# Patient Record
Sex: Female | Born: 1960
Health system: Southern US, Community
[De-identification: ages and names within clinical notes are randomized; demographics above are authoritative.]

## PROBLEM LIST (undated history)

## (undated) DIAGNOSIS — D573 Sickle-cell trait: Secondary | ICD-10-CM

## (undated) DIAGNOSIS — Z780 Asymptomatic menopausal state: Secondary | ICD-10-CM

## (undated) DIAGNOSIS — K219 Gastro-esophageal reflux disease without esophagitis: Secondary | ICD-10-CM

## (undated) DIAGNOSIS — R51 Headache: Secondary | ICD-10-CM

## (undated) DIAGNOSIS — D649 Anemia, unspecified: Secondary | ICD-10-CM

## (undated) HISTORY — DX: Headache: R51

## (undated) HISTORY — DX: Sickle-cell trait: D57.3

## (undated) HISTORY — DX: Asymptomatic menopausal state: Z78.0

## (undated) HISTORY — DX: Anemia, unspecified: D64.9

## (undated) HISTORY — PX: CHOLECYSTECTOMY: SHX55

## (undated) HISTORY — DX: Gastro-esophageal reflux disease without esophagitis: K21.9

---

## 1997-07-21 HISTORY — PX: ABDOMINAL HYSTERECTOMY: SHX81

## 1997-12-26 ENCOUNTER — Ambulatory Visit (HOSPITAL_COMMUNITY): Admission: RE | Admit: 1997-12-26 | Discharge: 1997-12-26 | Payer: Self-pay | Admitting: Obstetrics and Gynecology

## 1998-01-16 ENCOUNTER — Inpatient Hospital Stay (HOSPITAL_COMMUNITY): Admission: EM | Admit: 1998-01-16 | Discharge: 1998-02-02 | Payer: Self-pay | Admitting: Emergency Medicine

## 1998-01-17 ENCOUNTER — Ambulatory Visit (HOSPITAL_COMMUNITY): Admission: RE | Admit: 1998-01-17 | Discharge: 1998-01-17 | Payer: Self-pay | Admitting: Obstetrics and Gynecology

## 1998-03-09 ENCOUNTER — Observation Stay (HOSPITAL_COMMUNITY): Admission: RE | Admit: 1998-03-09 | Discharge: 1998-03-10 | Payer: Self-pay | Admitting: *Deleted

## 1998-03-12 ENCOUNTER — Inpatient Hospital Stay (HOSPITAL_COMMUNITY): Admission: EM | Admit: 1998-03-12 | Discharge: 1998-03-17 | Payer: Self-pay | Admitting: Internal Medicine

## 1998-03-20 ENCOUNTER — Ambulatory Visit (HOSPITAL_COMMUNITY): Admission: RE | Admit: 1998-03-20 | Discharge: 1998-03-20 | Payer: Self-pay | Admitting: Gastroenterology

## 1998-04-20 ENCOUNTER — Ambulatory Visit (HOSPITAL_COMMUNITY): Admission: RE | Admit: 1998-04-20 | Discharge: 1998-04-20 | Payer: Self-pay | Admitting: Gastroenterology

## 1998-04-20 ENCOUNTER — Encounter: Payer: Self-pay | Admitting: Gastroenterology

## 1999-11-26 ENCOUNTER — Other Ambulatory Visit: Admission: RE | Admit: 1999-11-26 | Discharge: 1999-11-26 | Payer: Self-pay | Admitting: Internal Medicine

## 2002-05-12 ENCOUNTER — Encounter: Admission: RE | Admit: 2002-05-12 | Discharge: 2002-05-12 | Payer: Self-pay | Admitting: Internal Medicine

## 2002-05-12 ENCOUNTER — Encounter: Payer: Self-pay | Admitting: Internal Medicine

## 2003-05-03 ENCOUNTER — Encounter: Payer: Self-pay | Admitting: Internal Medicine

## 2003-05-03 ENCOUNTER — Ambulatory Visit (HOSPITAL_COMMUNITY): Admission: RE | Admit: 2003-05-03 | Discharge: 2003-05-03 | Payer: Self-pay | Admitting: Internal Medicine

## 2003-12-25 ENCOUNTER — Other Ambulatory Visit: Admission: RE | Admit: 2003-12-25 | Discharge: 2003-12-25 | Payer: Self-pay | Admitting: Internal Medicine

## 2004-07-21 HISTORY — PX: OOPHORECTOMY: SHX86

## 2004-08-09 ENCOUNTER — Ambulatory Visit: Payer: Self-pay | Admitting: Internal Medicine

## 2005-01-22 ENCOUNTER — Ambulatory Visit: Payer: Self-pay | Admitting: Gastroenterology

## 2005-02-11 ENCOUNTER — Ambulatory Visit: Payer: Self-pay | Admitting: Gastroenterology

## 2005-03-10 ENCOUNTER — Ambulatory Visit: Payer: Self-pay | Admitting: Internal Medicine

## 2005-10-23 ENCOUNTER — Ambulatory Visit: Payer: Self-pay | Admitting: Internal Medicine

## 2005-10-31 ENCOUNTER — Ambulatory Visit: Payer: Self-pay | Admitting: Internal Medicine

## 2005-11-04 ENCOUNTER — Ambulatory Visit: Payer: Self-pay | Admitting: Internal Medicine

## 2005-12-11 ENCOUNTER — Ambulatory Visit: Payer: Self-pay | Admitting: Internal Medicine

## 2006-03-12 ENCOUNTER — Ambulatory Visit (HOSPITAL_COMMUNITY): Admission: RE | Admit: 2006-03-12 | Discharge: 2006-03-12 | Payer: Self-pay | Admitting: Obstetrics and Gynecology

## 2006-03-12 ENCOUNTER — Encounter (INDEPENDENT_AMBULATORY_CARE_PROVIDER_SITE_OTHER): Payer: Self-pay | Admitting: Specialist

## 2007-01-18 ENCOUNTER — Telehealth (INDEPENDENT_AMBULATORY_CARE_PROVIDER_SITE_OTHER): Payer: Self-pay | Admitting: *Deleted

## 2007-01-18 ENCOUNTER — Emergency Department (HOSPITAL_COMMUNITY): Admission: EM | Admit: 2007-01-18 | Discharge: 2007-01-18 | Payer: Self-pay | Admitting: Family Medicine

## 2007-04-22 ENCOUNTER — Ambulatory Visit: Payer: Self-pay | Admitting: Internal Medicine

## 2007-04-22 DIAGNOSIS — D649 Anemia, unspecified: Secondary | ICD-10-CM

## 2007-04-22 DIAGNOSIS — K219 Gastro-esophageal reflux disease without esophagitis: Secondary | ICD-10-CM

## 2007-04-22 DIAGNOSIS — D573 Sickle-cell trait: Secondary | ICD-10-CM

## 2007-04-22 DIAGNOSIS — N924 Excessive bleeding in the premenopausal period: Secondary | ICD-10-CM | POA: Insufficient documentation

## 2007-04-22 HISTORY — DX: Sickle-cell trait: D57.3

## 2007-04-22 HISTORY — DX: Gastro-esophageal reflux disease without esophagitis: K21.9

## 2007-04-22 HISTORY — DX: Anemia, unspecified: D64.9

## 2007-04-22 LAB — CONVERTED CEMR LAB: FSH: 119.6 milliintl units/mL

## 2007-04-26 ENCOUNTER — Telehealth: Payer: Self-pay | Admitting: *Deleted

## 2007-07-07 ENCOUNTER — Ambulatory Visit: Payer: Self-pay | Admitting: Internal Medicine

## 2007-07-07 LAB — CONVERTED CEMR LAB
ALT: 19 units/L (ref 0–35)
AST: 23 units/L (ref 0–37)
Albumin: 4 g/dL (ref 3.5–5.2)
Alkaline Phosphatase: 69 units/L (ref 39–117)
BUN: 8 mg/dL (ref 6–23)
Basophils Absolute: 0 10*3/uL (ref 0.0–0.1)
Basophils Relative: 0.2 % (ref 0.0–1.0)
Bilirubin Urine: NEGATIVE
Bilirubin, Direct: 0.3 mg/dL (ref 0.0–0.3)
Blood in Urine, dipstick: NEGATIVE
CO2: 32 meq/L (ref 19–32)
Calcium: 9.3 mg/dL (ref 8.4–10.5)
Chloride: 108 meq/L (ref 96–112)
Cholesterol: 147 mg/dL (ref 0–200)
Creatinine, Ser: 0.6 mg/dL (ref 0.4–1.2)
Eosinophils Absolute: 0.1 10*3/uL (ref 0.0–0.6)
Eosinophils Relative: 1 % (ref 0.0–5.0)
GFR calc Af Amer: 139 mL/min
GFR calc non Af Amer: 115 mL/min
Glucose, Bld: 83 mg/dL (ref 70–99)
Glucose, Urine, Semiquant: NEGATIVE
HCT: 32.2 % — ABNORMAL LOW (ref 36.0–46.0)
HDL: 41.7 mg/dL (ref >39.0)
Hemoglobin: 10.8 g/dL — ABNORMAL LOW (ref 12.0–15.0)
Ketones, urine, test strip: NEGATIVE
LDL Cholesterol: 95 mg/dL (ref 0–99)
Lymphocytes Relative: 34.9 % (ref 12.0–46.0)
MCHC: 33.6 g/dL (ref 30.0–36.0)
MCV: 79.5 fL (ref 78.0–100.0)
Monocytes Absolute: 0.6 10*3/uL (ref 0.2–0.7)
Monocytes Relative: 10 % (ref 3.0–11.0)
Neutro Abs: 3.1 10*3/uL (ref 1.4–7.7)
Neutrophils Relative %: 53.9 % (ref 43.0–77.0)
Nitrite: NEGATIVE
Platelets: 172 10*3/uL (ref 150–400)
Potassium: 4.3 meq/L (ref 3.5–5.1)
Protein, U semiquant: NEGATIVE
RBC: 4.05 M/uL (ref 3.87–5.11)
RDW: 12.9 % (ref 11.5–14.6)
Sodium: 145 meq/L (ref 135–145)
Specific Gravity, Urine: 1.015
TSH: 1.92 microintl units/mL (ref 0.35–5.50)
Total Bilirubin: 1.6 mg/dL — ABNORMAL HIGH (ref 0.3–1.2)
Total CHOL/HDL Ratio: 3.5
Total Protein: 7 g/dL (ref 6.0–8.3)
Triglycerides: 53 mg/dL (ref 0–149)
Urobilinogen, UA: NEGATIVE
VLDL: 11 mg/dL (ref 0–40)
WBC: 5.8 10*3/uL (ref 4.5–10.5)
pH: 6.5

## 2007-07-13 ENCOUNTER — Ambulatory Visit: Payer: Self-pay | Admitting: Internal Medicine

## 2007-07-13 DIAGNOSIS — R51 Headache: Secondary | ICD-10-CM | POA: Insufficient documentation

## 2007-07-13 DIAGNOSIS — J309 Allergic rhinitis, unspecified: Secondary | ICD-10-CM | POA: Insufficient documentation

## 2007-07-13 DIAGNOSIS — R519 Headache, unspecified: Secondary | ICD-10-CM | POA: Insufficient documentation

## 2007-07-13 HISTORY — DX: Headache: R51

## 2007-07-19 ENCOUNTER — Ambulatory Visit: Payer: Self-pay | Admitting: Internal Medicine

## 2007-07-19 ENCOUNTER — Encounter: Payer: Self-pay | Admitting: Internal Medicine

## 2007-08-02 ENCOUNTER — Telehealth: Payer: Self-pay | Admitting: *Deleted

## 2008-01-17 ENCOUNTER — Emergency Department (HOSPITAL_COMMUNITY): Admission: EM | Admit: 2008-01-17 | Discharge: 2008-01-17 | Payer: Self-pay | Admitting: Family Medicine

## 2008-07-03 ENCOUNTER — Telehealth: Payer: Self-pay | Admitting: Internal Medicine

## 2008-08-18 LAB — CONVERTED CEMR LAB: Pap Smear: NORMAL

## 2009-03-05 ENCOUNTER — Encounter: Payer: Self-pay | Admitting: Internal Medicine

## 2009-04-20 ENCOUNTER — Ambulatory Visit: Payer: Self-pay | Admitting: Internal Medicine

## 2009-04-20 LAB — CONVERTED CEMR LAB
ALT: 20 units/L (ref 0–35)
AST: 25 units/L (ref 0–37)
Albumin: 4.3 g/dL (ref 3.5–5.2)
Alkaline Phosphatase: 67 units/L (ref 39–117)
BUN: 11 mg/dL (ref 6–23)
Basophils Absolute: 0 10*3/uL (ref 0.0–0.1)
Basophils Relative: 0.5 % (ref 0.0–3.0)
Bilirubin Urine: NEGATIVE
Bilirubin, Direct: 0.2 mg/dL (ref 0.0–0.3)
CO2: 32 meq/L (ref 19–32)
Calcium: 9.1 mg/dL (ref 8.4–10.5)
Chloride: 109 meq/L (ref 96–112)
Cholesterol: 142 mg/dL (ref 0–200)
Creatinine, Ser: 0.7 mg/dL (ref 0.4–1.2)
Eosinophils Absolute: 0 10*3/uL (ref 0.0–0.7)
Eosinophils Relative: 0.9 % (ref 0.0–5.0)
GFR calc non Af Amer: 114.98 mL/min (ref >60)
Glucose, Bld: 86 mg/dL (ref 70–99)
HCT: 30.9 % — ABNORMAL LOW (ref 36.0–46.0)
HDL: 42.4 mg/dL (ref >39.00)
Hemoglobin, Urine: NEGATIVE
Hemoglobin: 10.4 g/dL — ABNORMAL LOW (ref 12.0–15.0)
Ketones, ur: NEGATIVE mg/dL
LDL Cholesterol: 87 mg/dL (ref 0–99)
Leukocytes, UA: NEGATIVE
Lymphocytes Relative: 35.1 % (ref 12.0–46.0)
Lymphs Abs: 1.9 10*3/uL (ref 0.7–4.0)
MCHC: 33.7 g/dL (ref 30.0–36.0)
MCV: 79.1 fL (ref 78.0–100.0)
Monocytes Absolute: 0.6 10*3/uL (ref 0.1–1.0)
Monocytes Relative: 10.5 % (ref 3.0–12.0)
Neutro Abs: 2.8 10*3/uL (ref 1.4–7.7)
Neutrophils Relative %: 53 % (ref 43.0–77.0)
Nitrite: NEGATIVE
Platelets: 146 10*3/uL — ABNORMAL LOW (ref 150.0–400.0)
Potassium: 3.9 meq/L (ref 3.5–5.1)
RBC: 3.91 M/uL (ref 3.87–5.11)
RDW: 12.8 % (ref 11.5–14.6)
Sodium: 145 meq/L (ref 135–145)
Specific Gravity, Urine: 1.01 (ref 1.000–1.030)
TSH: 1.97 microintl units/mL (ref 0.35–5.50)
Total Bilirubin: 1.4 mg/dL — ABNORMAL HIGH (ref 0.3–1.2)
Total CHOL/HDL Ratio: 3
Total Protein, Urine: NEGATIVE mg/dL
Total Protein: 7.8 g/dL (ref 6.0–8.3)
Triglycerides: 62 mg/dL (ref 0.0–149.0)
Urine Glucose: NEGATIVE mg/dL
Urobilinogen, UA: 1 (ref 0.0–1.0)
VLDL: 12.4 mg/dL (ref 0.0–40.0)
WBC: 5.3 10*3/uL (ref 4.5–10.5)
pH: 6 (ref 5.0–8.0)

## 2009-04-26 ENCOUNTER — Ambulatory Visit: Payer: Self-pay | Admitting: Internal Medicine

## 2009-04-26 LAB — HM MAMMOGRAPHY: HM Mammogram: NORMAL

## 2009-05-09 ENCOUNTER — Telehealth: Payer: Self-pay | Admitting: Internal Medicine

## 2009-09-26 ENCOUNTER — Ambulatory Visit: Payer: Self-pay | Admitting: Internal Medicine

## 2009-11-12 ENCOUNTER — Encounter: Payer: Self-pay | Admitting: Internal Medicine

## 2009-11-12 ENCOUNTER — Ambulatory Visit: Payer: Self-pay | Admitting: Internal Medicine

## 2009-12-19 ENCOUNTER — Ambulatory Visit: Payer: Self-pay | Admitting: Family Medicine

## 2009-12-19 DIAGNOSIS — M722 Plantar fascial fibromatosis: Secondary | ICD-10-CM | POA: Insufficient documentation

## 2009-12-19 LAB — HM PAP SMEAR

## 2009-12-19 LAB — HM MAMMOGRAPHY

## 2009-12-26 ENCOUNTER — Telehealth: Payer: Self-pay | Admitting: Internal Medicine

## 2010-02-20 ENCOUNTER — Encounter (INDEPENDENT_AMBULATORY_CARE_PROVIDER_SITE_OTHER): Payer: Self-pay | Admitting: *Deleted

## 2010-04-12 ENCOUNTER — Encounter (INDEPENDENT_AMBULATORY_CARE_PROVIDER_SITE_OTHER): Payer: Self-pay | Admitting: *Deleted

## 2010-04-15 ENCOUNTER — Ambulatory Visit: Payer: Self-pay | Admitting: Gastroenterology

## 2010-04-26 LAB — HM COLONOSCOPY: HM Colonoscopy: NORMAL

## 2010-04-29 ENCOUNTER — Ambulatory Visit: Payer: Self-pay | Admitting: Gastroenterology

## 2010-07-12 ENCOUNTER — Ambulatory Visit: Payer: Self-pay | Admitting: Family Medicine

## 2010-08-20 NOTE — Assessment & Plan Note (Signed)
Summary: foot pain--on/off x 1 month //ccm   Vital Signs:  Patient profile:   50 year old female Temp:     98 degrees F oral BP sitting:   120 / 72  (left arm) Cuff size:   regular  Vitals Entered By: Sid Falcon LPN (December 19, 1608 9:31 AM) CC: Rt foot pain, N & T at times   History of Present Illness: R foot pain one month.  Pain in heel region.  Achy soreness.  More constant.   Pain 7/10 with walking.  No recent  chnage of shoes.  Tylenol without relief.  Epsom salt soaks helped some. Pain worse after periods of sitting.  No injury.  Allergies (verified): No Known Drug Allergies  Past History:  Past Medical History: Last updated: 07/13/2007 menopausal sickle cell trait anemia GERD Allergic rhinitis Headache  Review of Systems      See HPI  Physical Exam  General:  Well-developed,well-nourished,in no acute distress; alert,appropriate and cooperative throughout examination Lungs:  Normal respiratory effort, chest expands symmetrically. Lungs are clear to auscultation, no crackles or wheezes. Heart:  normal rate and regular rhythm.   Extremities:  R foot tender plantar fascia.  Other wise no bony tenderness.  Good foot pulses.  No achilles tenderness. Neurologic:  alert & oriented X3, strength normal in all extremities, sensation intact to light touch, and DTRs symmetrical and normal.     Impression & Recommendations:  Problem # 1:  FASCIITIS, PLANTAR (ICD-728.71) Assessment New discussed ice, stretches, antiinflammatories, and appropriate shoe wear.  Offered injection of steroid but pt declines at this time.  Complete Medication List: 1)  Folbee 2.5-25-1 Mg Tabs (Folic acid-vit b6-vit b12) .... One by mouth daily 2)  Amoxicillin 500 Mg Caps (Amoxicillin) .... One by mouth three times a day for 10 days 3)  Fexofenadine-pseudoephedrine 60-120 Mg Xr12h-tab (Fexofenadine-pseudoephedrine) .... One by mouth two times a day as needed congestion and post nasal  drip  Patient Instructions: 1)  Ice heel couple times per day. 2)  Stretch heel frequently 3)  Consider heel cup for symptom relief.  4)  Consider use of Alleve or Advil for symtptom relief.

## 2010-08-20 NOTE — Progress Notes (Signed)
Summary: SINUS PROBLEM  Phone Note Call from Patient Call back at [phone]   Caller: PT [facility]IVE Call For: Stacie Glaze MD Summary of Call: PATIENT WOULD LIKE SOMETHING CALLED IN FOR SINUS PROLBEM,HEAD CONGESTION, COUGHING UP BROWN MUSCUS, X 2 WEEKS.  [facility] [id number]. Initial call taken by: Celine Ahr,  July 03, 2008 9:57 AM  Follow-up for Phone Call        Has tried Pseudafed, Tylenol XS, Akla Seltzer Plus with no relief, has not tried any of the [employer], denies fever or ear pain, C/O scratchy sore throat, pain in her face, productive cough, headache and nasal congestion Follow-up by: Sid Falcon LPN,  July 03, 2008 10:13 AM  Additional Follow-up for Phone Call Additional follow up Details #1::        ceftin 250 two times a day for 10 days and  bromfed or by mouth two times a day lodrane two times a day  Additional Follow-up by: Stacie Glaze MD,  July 03, 2008 1:41 PM    Additional Follow-up for Phone Call Additional follow up Details #2::    Per Dr Lovell Sheehan, if pharmacy doe not have Bromfed, may use alternate Lodrane.  Rx sent electronically Follow-up by: Sid Falcon LPN,  July 03, 2008 3:34 PM  New/Updated Medications: CEFTIN 250 MG TABS (CEFUROXIME AXETIL) one tab two times a day BROMFED 12-15 MG XR12H-CAP (BROMPHENIRAMINE-PHENYLEPHRINE) one tab two times a day   Prescriptions: BROMFED 12-15 MG XR12H-CAP (BROMPHENIRAMINE-PHENYLEPHRINE) one tab two times a day  #14 x 0   Entered by:   Sid Falcon LPN   Authorized by:   Stacie Glaze MD   Signed by:   Sid Falcon LPN on 45/40/9811   Method used:   Electronically to        [facility] (retail)       [address]       [place], [place]  [address]       Ph: [phone]       Fax: [phone]   RxID:   [id number] CEFTIN 250 MG TABS (CEFUROXIME AXETIL) one tab two times a day  #20 x 0   Entered by:   Sid Falcon LPN   Authorized  by:   Stacie Glaze MD   Signed by:   Sid Falcon LPN on 66/44/0347   Method used:   Electronically to        [facility] (retail)       [address]       [place], [place]  [address]       Ph: [phone]       Fax: [phone]   RxID:   [id number]   Appended Document: SINUS PROBLEM Pt sister answered phone at pt home, not able to call pt work.  Informed sister to have pt check with her pharmacy.

## 2010-08-20 NOTE — Miscellaneous (Signed)
Summary: Orders Update  Clinical Lists Changes  Orders: Added new Test order of T-Bone Densitometry (77080) - Signed Added new Test order of T-Lumbar Vertebral Assessment (77082) - Signed 

## 2010-08-20 NOTE — Miscellaneous (Signed)
Summary: previsit prep/rm  Clinical Lists Changes  Medications: Added new medication of MOVIPREP 100 GM  SOLR (PEG-KCL-NACL-NASULF-NA ASC-C) As per prep instructions. - Signed Rx of MOVIPREP 100 GM  SOLR (PEG-KCL-NACL-NASULF-NA ASC-C) As per prep instructions.;  #1 x 0;  Signed;  Entered by: Sherren Kerns RN;  Authorized by: Louis Meckel MD;  Method used: Electronically to [facility] [address], [place], [place], [place]  [address], Ph: [phone], Fax: [phone] Observations: Added new observation of ALLERGY REV: Done (04/15/2010 8:05)    Prescriptions: MOVIPREP 100 GM  SOLR (PEG-KCL-NACL-NASULF-NA ASC-C) As per prep instructions.  #1 x 0   Entered by:   Sherren Kerns RN   Authorized by:   Louis Meckel MD   Signed by:   Sherren Kerns RN on 04/15/2010   Method used:   Electronically to        [facility] ([facility])       [address]       [place], [place]  [address]       Ph: [phone]       Fax: [phone]   RxID:   [id number]

## 2010-08-20 NOTE — Progress Notes (Signed)
Summary: cp and short breath  Phone Note Call from Patient   Complaint: Chest Pain Summary of Call: CP and short breath off & on started last week.   Todaypresent with dull upper back pain.  Had nosebleed & HA last pm.  [phone] Initial call taken by: Rudy Jew,  January 18, 2007 11:30 AM  Follow-up for Phone Call        Patient advised to go to ER Follow-up by: Stacie Glaze MD,  January 18, 2007 11:33 AM  Additional Follow-up for Phone Call Additional follow up Details #1::        pt advised Additional Follow-up by: Rudy Jew,  January 18, 2007 11:35 AM

## 2010-08-20 NOTE — Assessment & Plan Note (Signed)
Summary: cpx/ccm   Vital Signs:  Patient Profile:   50 Years Old Female Height:     64 inches Weight:      140 pounds Temp:     98.5 degrees F oral Pulse rate:   76 / minute Resp:     12 per minute BP sitting:   110 / 80  (left arm)  Vitals Entered By: Willy Eddy, LPN (April 22, 2007 2:10 PM)                 Chief Complaint:  c/o cold sweats/thinks she is menopausal/c/o sore throatwill make appt for cpx later.  History of Present Illness: Sinusitis   Current Allergies: No known allergies   Past Medical History:    Anemia-NOS    GERD    Sickle Cell Trait 282.5  Past Surgical History:    Cholecystectomy    Hysterectomy      Physical Exam  General:     Well-developed,well-nourished,in no acute distress; alert,appropriate and cooperative throughout examination Eyes:     No corneal or conjunctival inflammation noted. EOMI. Perrla. Funduscopic exam benign, without hemorrhages, exudates or papilledema. Vision grossly normal. Nose:     External nasal examination shows no deformity or inflammation. Nasal mucosa are pink and moist without lesions or exudates. Mouth:     Oral mucosa and oropharynx without lesions or exudates.  Teeth in good repair. Neck:     No deformities, masses, or tenderness noted. Lungs:     Normal respiratory effort, chest expands symmetrically. Lungs are clear to auscultation, no crackles or wheezes. Heart:     Normal rate and regular rhythm. S1 and S2 normal without gallop, murmur, click, rub or other extra sounds. Abdomen:     Bowel sounds positive,abdomen soft and non-tender without masses, organomegaly or hernias noted. Msk:     No deformity or scoliosis noted of thoracic or lumbar spine.   Pulses:     R and L carotid,radial,femoral,dorsalis pedis and posterior tibial pulses are full and equal bilaterally    Impression & Recommendations:  Problem # 1:  MENORRHEA, PREMENOPAUSAL (ICD-627.0)  Orders: T- * Misc.  Laboratory test [phone]) Venipuncture [phone]) TLB-FSH (Follicle Stimulating Hormone) (83001-FSH)   Problem # 2:  ANEMIA-NOS (ICD-285.9)  Problem # 3:  SICKLE-CELL TRAIT (ICD-282.5) anemia   Patient Instructions: 1)  schedule CPX in next 2 months may use two spots anywhere 2)  BMP prior to visit, ICD-9: 3)  Hepatic Panel prior to visit, ICD-9: 4)  Lipid Panel prior to visit, ICD-9: 5)  TSH prior to visit, ICD-9:  V70.0 6)  CBC w/ Diff prior to visit, ICD-9: 7)  Urine-dip prior to visit, ICD-9:    ]  Tetanus/Td Immunization History:    Tetanus/Td # 1:  Td (07/21/2005)

## 2010-08-20 NOTE — Letter (Signed)
Summary: [facility] Instructions  [facility]  [address] [place], [place] [address]   Phone: [phone]  Fax: [phone]       Deanna Watkins    1960-10-29    MRN: [id number]        Procedure Day Dorna Bloom:  Duanne Limerick  04/29/10     Arrival Time:  8:30AM      Procedure Time:  9:30AM     Location of Procedure:                    _ X_  [facility] (4th Floor)                       PREPARATION FOR COLONOSCOPY WITH MOVIPREP   Starting 5 days prior to your procedure 04/24/10 do not eat nuts, seeds, popcorn, corn, beans, peas,  salads, or any raw vegetables.  Do not take any fiber supplements (e.g. Metamucil, Citrucel, and Benefiber).  THE DAY BEFORE YOUR PROCEDURE         DATE: 04/28/10  DAY: SUNDAY  1.  Drink clear liquids the entire day-NO SOLID FOOD  2.  Do not drink anything colored red or purple.  Avoid juices with pulp.  No orange juice.  3.  Drink at least 64 oz. (8 glasses) of fluid/clear liquids during the day to prevent dehydration and help the prep work efficiently.  CLEAR LIQUIDS INCLUDE: Water Jello Ice Popsicles Tea (sugar ok, no milk/cream) Powdered fruit flavored drinks Coffee (sugar ok, no milk/cream) Gatorade Juice: apple, white grape, white cranberry  Lemonade Clear bullion, consomm, broth Carbonated beverages (any kind) Strained chicken noodle soup Hard Candy                             4.  In the morning, mix first dose of MoviPrep solution:    Empty 1 Pouch A and 1 Pouch B into the disposable container    Add lukewarm drinking water to the top line of the container. Mix to dissolve    Refrigerate (mixed solution should be used within 24 hrs)  5.  Begin drinking the prep at 5:00 p.m. The MoviPrep container is divided by 4 marks.   Every 15 minutes drink the solution down to the next mark (approximately 8 oz) until the full liter is complete.   6.  Follow completed prep with 16 oz of clear liquid of your choice (Nothing  red or purple).  Continue to drink clear liquids until bedtime.  7.  Before going to bed, mix second dose of MoviPrep solution:    Empty 1 Pouch A and 1 Pouch B into the disposable container    Add lukewarm drinking water to the top line of the container. Mix to dissolve    Refrigerate  THE DAY OF YOUR PROCEDURE      DATE: 04/29/10  DAY: MONDAY  Beginning at 4:30AM (5 hours before procedure):         1. Every 15 minutes, drink the solution down to the next mark (approx 8 oz) until the full liter is complete.  2. Follow completed prep with 16 oz. of clear liquid of your choice.    3. You may drink clear liquids until 7:30AM (2 HOURS BEFORE PROCEDURE).   MEDICATION INSTRUCTIONS  Unless otherwise instructed, you should take regular prescription medications with a small sip of water   as early as possible the morning  of your procedure.    Additional medication instructions: n/a         OTHER INSTRUCTIONS  You will need a responsible adult at least 50 years of age to accompany you and drive you home.   This person must remain in the waiting room during your procedure.  Wear loose fitting clothing that is easily removed.  Leave jewelry and other valuables at home.  However, you may wish to bring a book to read or  an iPod/MP3 player to listen to music as you wait for your procedure to start.  Remove all body piercing jewelry and leave at home.  Total time from sign-in until discharge is approximately 2-3 hours.  You should go home directly after your procedure and rest.  You can resume normal activities the  day after your procedure.  The day of your procedure you should not:   Drive   Make legal decisions   Operate machinery   Drink alcohol   Return to work  You will receive specific instructions about eating, activities and medications before you leave.    The above instructions have been reviewed and explained to me by   Sherren Kerns RN  April 15, 2010 8:39 AM  I fully understand and can verbalize these instructions _____________________________ Date _________

## 2010-08-20 NOTE — Letter (Signed)
Summary: Colonoscopy Letter  [facility]  [address] [place], [place] [address]   Phone: [phone]  Fax: [phone]      February 20, 2010 MRN: [id number]   CALEYAH JR [address] [place], [place]  [address]   Dear Ms. CRISOSTOMO,   According to your medical record, it is time for you to schedule a Colonoscopy. The American Cancer Society recommends this procedure as a method to detect early colon cancer. Patients with a family history of colon cancer, or a personal history of colon polyps or inflammatory bowel disease are at increased risk.  This letter has been generated based on the recommendations made at the time of your procedure. If you feel that in your particular situation this may no longer apply, please contact our office.  Please call our office at [phone] to schedule this appointment or to update your records at your earliest convenience.  Thank you for cooperating with [place] to provide you with the very best care possible.   Sincerely,   Barbette Hair. Arlyce Dice, M.D.  [facility] [phone]

## 2010-08-20 NOTE — Miscellaneous (Signed)
Summary: BONE DENSITY  Clinical Lists Changes  Orders: Added new Test order of T-Bone Densitometry (77080) - Signed Added new Test order of T-Lumbar Vertebral Assessment (77082) - Signed 

## 2010-08-20 NOTE — Progress Notes (Signed)
Summary: test results  Phone Note Call from Patient Call back at Work Phone [phone]   Caller: Patient Call For: Lovell Sheehan Reason for Call: Talk to Doctor, Lab or Test Results Summary of Call: Need results of Bone density test Initial call taken by: Barnie Mort,  August 02, 2007 10:27 AM  Follow-up for Phone Call        left message on machine not available yet and will call when it is available Follow-up by: Willy Eddy, LPN,  August 02, 2007 12:46 PM

## 2010-08-20 NOTE — Progress Notes (Signed)
Summary: Requesting Antibiotic  Phone Note Call from Patient Call back at Home Phone [phone] Call back at [phone]   Caller: Patient Summary of Call: Having flu like symptoms, daughter is sick as well would like to have an antibiotic called in to [facility] on [address]  Initial call taken by: Trixie Dredge,  May 09, 2009 10:36 AM  Follow-up for Phone Call        ACHING FEELING WITH NASAL CONGESTION -EXPLAINED NO ANTIBIOITC FOR FLU LIKE SX- SUGGESTED TO TR SX- ADVILL FOR MYALGIA DN MUCINEX FOR CONGESTION-CALL BACK IF NOT BETTER BY FRIDAY Follow-up by: Willy Eddy, LPN,  May 09, 2009 11:40 AM

## 2010-08-20 NOTE — Progress Notes (Signed)
Summary: LMTCB 6-8  Phone Note Call from Patient Call back at [facility] Phone [phone]   Summary of Call: OV request for Vascular headache for 3 days.  Back after last year.  Small jagged light that increases in eyes to point she can't see.  Headache starts & light goes away.  Headache is lingering.  2 Aleve helped the pain today completely.  Gets a little nausea.  Also wonders about sinus, around face & nose sore to touch.  [employer].  NKDA. Initial call taken by: Rudy Jew, RN,  December 26, 2009 12:40 PM  Follow-up for Phone Call        if it was relieved with aleve, it is probably not a sinus headache-could be migraine- contnue to take aleve as needed and see dr Lovell Sheehan in the n ext couple of weeks to evaluated for migraine - keep a headche log, documenting when head starts, how long it last, and what she was doing when it started per dr Lovell Sheehan . Follow-up by: Willy Eddy, LPN,  December 27, 6642 4:28 PM  Additional Follow-up for Phone Call Additional follow up Details #1::        Left message to call back.  Rudy Jew, RN  December 26, 2009 4:35 PM     Additional Follow-up for Phone Call Additional follow up Details #2::    Pt. notified. Follow-up by: Lynann Beaver CMA,  December 27, 2009 8:12 AM

## 2010-08-20 NOTE — Assessment & Plan Note (Signed)
Summary: SORE THROAT, SINUSITIS // RS   Vital Signs:  Patient profile:   50 year old female Height:      63.25 inches Weight:      151 pounds BMI:     26.63 Temp:     98.2 degrees F oral Pulse rate:   72 / minute Resp:     14 per minute BP sitting:   110 / 70  (left arm)  Vitals Entered By: Willy Eddy, LPN (September 26, 452 12:01 PM) CC: c/o sore thoat and post nasal drainage, URI symptoms   CC:  c/o sore thoat and post nasal drainage and URI symptoms.  History of Present Illness:  URI Symptoms      This is a 50 year old woman who presents with URI symptoms.  The patient reports nasal congestion, purulent nasal discharge, and sore throat, but denies clear nasal discharge, dry cough, productive cough, earache, and sick contacts.  The patient denies fever, low-grade fever (<100.5 degrees), fever of 100.5-103 degrees, fever of 103.1-104 degrees, fever to >104 degrees, stiff neck, dyspnea, wheezing, rash, vomiting, diarrhea, use of an antipyretic, and response to antipyretic.  The patient also reports headache.  The patient denies the following risk factors for Strep sinusitis: unilateral facial pain, unilateral nasal discharge, poor response to decongestant, double sickening, tooth pain, Strep exposure, tender adenopathy, and absence of cough.    Preventive Screening-Counseling & Management  Alcohol-Tobacco     Smoking Status: never     Passive Smoke Exposure: no  Problems Prior to Update: 1)  Family History of Colon Ca 1st Degree Relative <60  (ICD-V16.0) 2)  Family History Breast Cancer 1st Degree Relative <50  (ICD-V16.3) 3)  Headache  (ICD-784.0) 4)  Allergic Rhinitis  (ICD-477.9) 5)  Physical Examination  (ICD-V70.0) 6)  Sinusitis, Acute Frontal  (ICD-461.1) 7)  Menorrhea, Premenopausal  (ICD-627.0) 8)  Sickle-cell Trait  (ICD-282.5) 9)  Gerd  (ICD-530.81) 10)  Anemia-nos  (ICD-285.9)  Current Problems (verified): 1)  Family History of Colon Ca 1st Degree Relative  <60  (ICD-V16.0) 2)  Family History Breast Cancer 1st Degree Relative <50  (ICD-V16.3) 3)  Headache  (ICD-784.0) 4)  Allergic Rhinitis  (ICD-477.9) 5)  Physical Examination  (ICD-V70.0) 6)  Sinusitis, Acute Frontal  (ICD-461.1) 7)  Menorrhea, Premenopausal  (ICD-627.0) 8)  Sickle-cell Trait  (ICD-282.5) 9)  Gerd  (ICD-530.81) 10)  Anemia-nos  (ICD-285.9)  Medications Prior to Update: 1)  Folbee 2.5-25-1 Mg Tabs (Folic Acid-Vit B6-Vit B12) .... One By Mouth Daily  Current Medications (verified): 1)  Folbee 2.5-25-1 Mg Tabs (Folic Acid-Vit B6-Vit B12) .... One By Mouth Daily 2)  Amoxicillin 500 Mg Caps (Amoxicillin) .... One By Mouth Three Times A Day For 10 Days 3)  Fexofenadine-Pseudoephedrine 60-120 Mg Xr12h-Tab (Fexofenadine-Pseudoephedrine) .... One By Mouth Two Times A Day As Needed Congestion and Post Nasal Drip  Allergies (verified): No Known Drug Allergies  Past History:  Family History: Last updated: 07/13/2007 sister Family History Breast cancer 1st degree relative <50 mother Family History of Colon CA 1st degree relative <60  Social History: Last updated: 07/13/2007 Divorced Never Smoked Alcohol use-no Drug use-no Regular exercise-no  Risk Factors: Exercise: no (07/13/2007)  Risk Factors: Smoking Status: never (09/26/2009) Passive Smoke Exposure: no (09/26/2009)  Past medical, surgical, family and social histories (including risk factors) reviewed, and no changes noted (except as noted below).  Past Medical History: Reviewed history from 07/13/2007 and no changes required. menopausal sickle cell trait anemia GERD Allergic  rhinitis Headache  Past Surgical History: Reviewed history from 07/13/2007 and no changes required. Cholecystectomy Hysterectomy99 Oophorectomy2006  Family History: Reviewed history from 07/13/2007 and no changes required. sister Family History Breast cancer 1st degree relative <50 mother Family History of Colon CA 1st  degree relative <60  Social History: Reviewed history from 07/13/2007 and no changes required. Divorced Never Smoked Alcohol use-no Drug use-no Regular exercise-no  Review of Systems  The patient denies anorexia, fever, weight loss, weight gain, vision loss, decreased hearing, hoarseness, chest pain, syncope, dyspnea on exertion, peripheral edema, prolonged cough, headaches, hemoptysis, abdominal pain, melena, hematochezia, severe indigestion/heartburn, hematuria, incontinence, genital sores, muscle weakness, suspicious skin lesions, transient blindness, difficulty walking, depression, unusual weight change, abnormal bleeding, enlarged lymph nodes, angioedema, and breast masses.    Physical Exam  General:  Well-developed,well-nourished,in no acute distress; alert,appropriate and cooperative throughout examination Head:  normocephalic and atraumatic.   Eyes:  pupils equal and pupils round.   Ears:  R ear normal and no external deformities.   Nose:  no external deformity and nasal dischargemucosal pallor.   Mouth:  pharynx pink and moist and postnasal drip.   Neck:  No deformities, masses, or tenderness noted. Lungs:  Normal respiratory effort, chest expands symmetrically. Lungs are clear to auscultation, no crackles or wheezes. Heart:  Normal rate and regular rhythm. S1 and S2 normal without gallop, murmur, click, rub or other extra sounds. Abdomen:  Bowel sounds positive,abdomen soft and non-tender without masses, organomegaly or hernias noted. Msk:  No deformity or scoliosis noted of thoracic or lumbar spine.   Pulses:  R and L carotid,radial,femoral,dorsalis pedis and posterior tibial pulses are full and equal bilaterally   Impression & Recommendations:  Problem # 1:  ACUTE MAXILLARY SINUSITIS (ICD-461.0) discussed tratment and antibiotics Instructed on treatment. Call if symptoms persist or worsen.   Her updated medication list for this problem includes:    Amoxicillin 500 Mg  Caps (Amoxicillin) ..... One by mouth three times a day for 10 days    Fexofenadine-pseudoephedrine 60-120 Mg Xr12h-tab (Fexofenadine-pseudoephedrine) ..... One by mouth two times a day as needed congestion and post nasal drip  Complete Medication List: 1)  Folbee 2.5-25-1 Mg Tabs (Folic acid-vit b6-vit b12) .... One by mouth daily 2)  Amoxicillin 500 Mg Caps (Amoxicillin) .... One by mouth three times a day for 10 days 3)  Fexofenadine-pseudoephedrine 60-120 Mg Xr12h-tab (Fexofenadine-pseudoephedrine) .... One by mouth two times a day as needed congestion and post nasal drip  Patient Instructions: 1)  Take your antibiotic as prescribed until ALL of it is gone, but stop if you develop a rash or swelling and contact our office as soon as possible. Prescriptions: FEXOFENADINE-PSEUDOEPHEDRINE 60-120 MG XR12H-TAB (FEXOFENADINE-PSEUDOEPHEDRINE) one by mouth two times a day as needed congestion and post nasal drip  #60 x 2   Entered and Authorized by:   Stacie Glaze MD   Signed by:   Stacie Glaze MD on 09/26/2009   Method used:   Electronically to        [facility] (retail)       [address]       [place], [place]  [address]       Ph: [phone]       Fax: [phone]   RxID:   [id number] AMOXICILLIN 500 MG CAPS (AMOXICILLIN) one by mouth three times a day for 10 days  #30 x 0   Entered and Authorized by:  Stacie Glaze MD   Signed by:   Stacie Glaze MD on 09/26/2009   Method used:   Electronically to        [facility] (retail)       [address]       [place], [place]  [address]       Ph: [phone]       Fax: [phone]   RxID:   [id number]

## 2010-08-20 NOTE — Procedures (Signed)
Summary: Colonoscopy  Patient: Deanna Watkins Note: All result statuses are Final unless otherwise noted.  Tests: (1) Colonoscopy (COL)   COL Colonoscopy           DONE     [facility]     [address]     [place], [place]  [address]           COLONOSCOPY PROCEDURE REPORT           PATIENT:  Deanna Watkins, Deanna Watkins  MR#:  [id number]     BIRTHDATE:  10-Oct-1960, 48 yrs. old  GENDER:  female           ENDOSCOPIST:  Barbette Hair. Arlyce Dice, MD     Referred by:           PROCEDURE DATE:  04/29/2010     PROCEDURE:  Diagnostic Colonoscopy     ASA CLASS:  Class I     INDICATIONS:  1) Routine Risk Screening  2) family history of     colon cancer Mother           MEDICATIONS:   Fentanyl 75 mcg IV, Versed 7 mg IV           DESCRIPTION OF PROCEDURE:   After the risks benefits and     alternatives of the procedure were thoroughly explained, informed     consent was obtained.  Digital rectal exam was performed and     revealed no abnormalities.   The LB [id number] endoscope     was introduced through the anus and advanced to the cecum, which     was identified by both the appendix and ileocecal valve, without     limitations.  The quality of the prep was excellent, using     MiraLax.  The instrument was then slowly withdrawn as the colon     was fully examined.     <<PROCEDUREIMAGES>>           FINDINGS:  A normal appearing cecum, ileocecal valve, and     appendiceal orifice were identified. The ascending, hepatic     flexure, transverse, splenic flexure, descending, sigmoid colon,     and rectum appeared unremarkable (see image1, image2, image3,     image6, image7, image8, and image9).   Retroflexed views in the     rectum revealed no abnormalities.    The time to cecum =  2.75     minutes. The scope was then withdrawn (time =  6.0  min) from the     patient and the procedure completed.           COMPLICATIONS:  None           ENDOSCOPIC IMPRESSION:     1) Normal colon  RECOMMENDATIONS:     1) Given your significant family history of colon cancer, you     should have a repeat colonoscopy in 5 years           REPEAT EXAM:  In 5 year(s) for Colonoscopy.           ______________________________     Barbette Hair. Arlyce Dice, MD           CC: Stacie Glaze, MD           n.     Rosalie Doctor:   Barbette Hair. Kaplan at 04/29/2010 09:57 AM           Deanna Watkins, [id number]  Note: An exclamation mark (!) indicates a  result that was not dispersed into the flowsheet. Document Creation Date: 04/29/2010 9:57 AM _______________________________________________________________________  (1) Order result status: Final Collection or observation date-time: 04/29/2010 09:52 Requested date-time:  Receipt date-time:  Reported date-time:  Referring Physician:   Ordering Physician: Melvia Heaps [phone]) Specimen Source:  Source: Launa Grill Order Number: [id number] Lab site:   Appended Document: Colonoscopy    Clinical Lists Changes  Observations: Added new observation of COLONNXTDUE: 04/2015 (04/29/2010 10:44)

## 2010-08-20 NOTE — Assessment & Plan Note (Signed)
Summary: cpx/mhf   Vital Signs:  Patient profile:   50 year old female Height:      63.25 inches Weight:      154 pounds BMI:     27.16 Temp:     98.1 degrees F oral Pulse rate:   76 / minute Resp:     12 per minute BP sitting:   110 / 74  (left arm)  Vitals Entered By: Willy Eddy, LPN (April 26, 2009 8:44 AM)  CC:  cpx.  History of Present Illness: The pt was asked about all immunizations, health maint. services that are appropriate to their age and was given guidance on diet exercize  and weight management   Problems Prior to Update: 1)  Family History of Colon Ca 1st Degree Relative <60  (ICD-V16.0) 2)  Family History Breast Cancer 1st Degree Relative <50  (ICD-V16.3) 3)  Headache  (ICD-784.0) 4)  Allergic Rhinitis  (ICD-477.9) 5)  Physical Examination  (ICD-V70.0) 6)  Sinusitis, Acute Frontal  (ICD-461.1) 7)  Menorrhea, Premenopausal  (ICD-627.0) 8)  Sickle-cell Trait  (ICD-282.5) 9)  Gerd  (ICD-530.81) 10)  Anemia-nos  (ICD-285.9)  Medications Prior to Update: 1)  Ceftin 250 Mg Tabs (Cefuroxime Axetil) .... One Tab Two Times A Day 2)  Bromfed 12-15 Mg Xr12h-Cap (Brompheniramine-Phenylephrine) .... One Tab Two Times A Day  Current Medications (verified): 1)  Folbee 2.5-25-1 Mg Tabs (Folic Acid-Vit B6-Vit B12) .... One By Mouth Daily  Allergies (verified): No Known Drug Allergies  Past History:  Family History: Last updated: 07/13/2007 sister Family History Breast cancer 1st degree relative <50 mother Family History of Colon CA 1st degree relative <60  Social History: Last updated: 07/13/2007 Divorced Never Smoked Alcohol use-no Drug use-no Regular exercise-no  Risk Factors: Exercise: no (07/13/2007)  Risk Factors: Smoking Status: never (07/13/2007) Passive Smoke Exposure: no (07/13/2007)  Past medical, surgical, family and social histories (including risk factors) reviewed, and no changes noted (except as noted below).  Past Medical  History: Reviewed history from 07/13/2007 and no changes required. menopausal sickle cell trait anemia GERD Allergic rhinitis Headache  Past Surgical History: Reviewed history from 07/13/2007 and no changes required. Cholecystectomy Hysterectomy99 Oophorectomy2006  Family History: Reviewed history from 07/13/2007 and no changes required. sister Family History Breast cancer 1st degree relative <50 mother Family History of Colon CA 1st degree relative <60  Social History: Reviewed history from 07/13/2007 and no changes required. Divorced Never Smoked Alcohol use-no Drug use-no Regular exercise-no  Review of Systems  The patient denies anorexia, fever, weight loss, weight gain, vision loss, decreased hearing, hoarseness, chest pain, syncope, dyspnea on exertion, peripheral edema, prolonged cough, headaches, hemoptysis, abdominal pain, melena, hematochezia, severe indigestion/heartburn, hematuria, incontinence, genital sores, muscle weakness, suspicious skin lesions, transient blindness, difficulty walking, depression, unusual weight change, abnormal bleeding, enlarged lymph nodes, angioedema, and breast masses.    Physical Exam  General:  Well-developed,well-nourished,in no acute distress; alert,appropriate and cooperative throughout examination Head:  normocephalic and atraumatic.   Eyes:  pupils equal and pupils round.   Ears:  R ear normal and no external deformities.   Nose:  no external deformity and nasal dischargemucosal pallor.   Mouth:  pharynx pink and moist and postnasal drip.   Neck:  No deformities, masses, or tenderness noted. Lungs:  Normal respiratory effort, chest expands symmetrically. Lungs are clear to auscultation, no crackles or wheezes. Heart:  Normal rate and regular rhythm. S1 and S2 normal without gallop, murmur, click, rub or other extra sounds.  Abdomen:  Bowel sounds positive,abdomen soft and non-tender without masses, organomegaly or hernias  noted. Msk:  No deformity or scoliosis noted of thoracic or lumbar spine.   Pulses:  R and L carotid,radial,femoral,dorsalis pedis and posterior tibial pulses are full and equal bilaterally Psych:  Oriented X3 and memory intact for recent and remote.     Impression & Recommendations:  Problem # 1:  PHYSICAL EXAMINATION (ICD-V70.0) sick trait addressed as a part of a comprehensive physical The pt was asked about all immunizations, health maint. services that are appropriate to their age and was given guidance on diet exercize  and weight management  Mammogram: normal (08/18/2008) Pap smear: normal (08/18/2008) Colonoscopy: Normal (02/11/2005) Td Booster: Td (07/21/2005)   Flu Vax: Historical (04/25/2009)   Chol: 142 (04/20/2009)   HDL: 42.40 (04/20/2009)   LDL: 87 (04/20/2009)   TG: 62.0 (04/20/2009) TSH: 1.97 (04/20/2009)   Next mammogram due:: 08/2009 (04/26/2009)  Discussed using sunscreen, use of alcohol, drug use, self breast exam, routine dental care, routine eye care, schedule for GYN exam, routine physical exam, seat belts, multiple vitamins, osteoporosis prevention, adequate calcium intake in diet, recommendations for immunizations, mammograms and Pap smears.  Discussed exercise and checking cholesterol.  Discussed gun safety, safe sex, and contraception.  Problem # 2:  SICKLE-CELL TRAIT (ICD-282.5)  Complete Medication List: 1)  Folbee 2.5-25-1 Mg Tabs (Folic acid-vit b6-vit b12) .... One by mouth daily  Patient Instructions: 1)  Please schedule a follow-up appointment in 1 year.  for cpx Prescriptions: FOLBEE 2.5-25-1 MG TABS (FOLIC ACID-VIT B6-VIT B12) one by mouth daily  #30 x 11   Entered and Authorized by:   Stacie Glaze MD   Signed by:   Stacie Glaze MD on 04/26/2009   Method used:   Electronically to        [facility] ([facility]ail)       [address]       [place], [place]  [address]       Ph: [phone]       Fax:  [phone]   RxID:   [id number]    Immunization History:  Influenza Immunization History:    Influenza:  historical (04/25/2009)       Preventive Care Screening  Mammogram:    Date:  08/18/2008    Next Due:  08/2009    Results:  normal   Pap Smear:    Date:  08/18/2008    Next Due:  08/2011    Results:  normal

## 2010-08-20 NOTE — Letter (Signed)
Summary: [facility]  [facility]   Imported By: Maryln Gottron 03/12/2009 08:25:07  _____________________________________________________________________  External Attachment:    Type:   Image     Comment:   External Document

## 2010-09-25 ENCOUNTER — Encounter: Payer: Self-pay | Admitting: Family Medicine

## 2010-09-25 ENCOUNTER — Ambulatory Visit (INDEPENDENT_AMBULATORY_CARE_PROVIDER_SITE_OTHER): Payer: 59 | Admitting: Family Medicine

## 2010-09-25 VITALS — BP 116/80 | Temp 99.2°F | Ht 63.5 in | Wt 144.0 lb

## 2010-09-25 DIAGNOSIS — J310 Chronic rhinitis: Secondary | ICD-10-CM

## 2010-09-25 DIAGNOSIS — R5381 Other malaise: Secondary | ICD-10-CM

## 2010-09-25 MED ORDER — TRIAMCINOLONE ACETONIDE(NASAL) 55 MCG/ACT NA INHA: 2.0000 | Freq: Every day | NASAL | Status: DC

## 2010-09-25 NOTE — Patient Instructions (Signed)
Consider scheduling complete physical with Dr Lovell Sheehan.

## 2010-09-25 NOTE — Progress Notes (Signed)
  Subjective:    Patient ID: Deanna Watkins, female    DOB: 07-09-1961, 50 y.o.   MRN: [id number]  HPI  Patient is seen with some sinus pressure over the past several days. Not localizing. In frontal sinus region. Occasional headaches. One or 2 episodes of bloody discharge but no purulent secretions. Denies fever or chills. No sore throat.  Does have history of seasonal allergies in the past. Occasional fleeting vertigo symptoms. Some chronic tendinitis which has been worked up previously. No hearing change. Generalized fatigue. She has history of sickle cell trait and chronic low-grade anemia.   Review of Systems     Objective:   Physical Exam  the patient is alert and in no distress. Vital signs as noted  Nasal exam is unremarkable  Facial exam reveals no visible swelling and no tenderness. Neck supple, no adenopathy Chest clear to auscultation Heart regular rhythm and rate Neuro-alert and oriented.  CN 2-12 normal. No ataxia.  No focal strength deficits.       Assessment & Plan:   #1 rhinitis, probably allergic. Nasacort AQ 2 sprays per nostril once daily.  Doubt bacterial sinusitis at this time. #2 fatigue uncertain etiology. Patient  Encouraged to schedule complete physical with her primary physician

## 2010-10-03 ENCOUNTER — Encounter: Payer: Self-pay | Admitting: Internal Medicine

## 2010-10-04 ENCOUNTER — Ambulatory Visit (INDEPENDENT_AMBULATORY_CARE_PROVIDER_SITE_OTHER): Payer: 59 | Admitting: Internal Medicine

## 2010-10-04 ENCOUNTER — Encounter: Payer: Self-pay | Admitting: Internal Medicine

## 2010-10-04 VITALS — BP 114/80 | HR 84 | Temp 99.4°F | Ht 63.5 in | Wt 143.0 lb

## 2010-10-04 DIAGNOSIS — H9201 Otalgia, right ear: Secondary | ICD-10-CM | POA: Insufficient documentation

## 2010-10-04 DIAGNOSIS — D649 Anemia, unspecified: Secondary | ICD-10-CM

## 2010-10-04 DIAGNOSIS — H9209 Otalgia, unspecified ear: Secondary | ICD-10-CM

## 2010-10-04 DIAGNOSIS — H6690 Otitis media, unspecified, unspecified ear: Secondary | ICD-10-CM

## 2010-10-04 DIAGNOSIS — H6121 Impacted cerumen, right ear: Secondary | ICD-10-CM | POA: Insufficient documentation

## 2010-10-04 DIAGNOSIS — H9319 Tinnitus, unspecified ear: Secondary | ICD-10-CM

## 2010-10-04 DIAGNOSIS — H6120 Impacted cerumen, unspecified ear: Secondary | ICD-10-CM

## 2010-10-04 DIAGNOSIS — H9311 Tinnitus, right ear: Secondary | ICD-10-CM | POA: Insufficient documentation

## 2010-10-04 LAB — CBC WITH DIFFERENTIAL/PLATELET
Basophils Absolute: 0 10*3/uL (ref 0.0–0.1)
Basophils Relative: 0.5 % (ref 0.0–3.0)
Eosinophils Absolute: 0.1 10*3/uL (ref 0.0–0.7)
Eosinophils Relative: 0.9 % (ref 0.0–5.0)
HCT: 31.2 % — ABNORMAL LOW (ref 36.0–46.0)
Hemoglobin: 10.5 g/dL — ABNORMAL LOW (ref 12.0–15.0)
Lymphocytes Relative: 38.1 % (ref 12.0–46.0)
Lymphs Abs: 2.2 10*3/uL (ref 0.7–4.0)
MCHC: 33.6 g/dL (ref 30.0–36.0)
MCV: 79.3 fl (ref 78.0–100.0)
Monocytes Absolute: 0.3 10*3/uL (ref 0.1–1.0)
Monocytes Relative: 6 % (ref 3.0–12.0)
Neutro Abs: 3.1 10*3/uL (ref 1.4–7.7)
Neutrophils Relative %: 54.5 % (ref 43.0–77.0)
Platelets: 181 10*3/uL (ref 150.0–400.0)
RBC: 3.94 Mil/uL (ref 3.87–5.11)
RDW: 15 % — ABNORMAL HIGH (ref 11.5–14.6)
WBC: 5.7 10*3/uL (ref 4.5–10.5)

## 2010-10-04 LAB — IRON: Iron: 37 ug/dL — ABNORMAL LOW (ref 42–145)

## 2010-10-04 MED ORDER — AMOXICILLIN-POT CLAVULANATE 875-125 MG PO TABS: 1.0000 | ORAL_TABLET | Freq: Two times a day (BID) | ORAL | Status: AC

## 2010-10-04 NOTE — Assessment & Plan Note (Signed)
.   Informed consent was obtained and peroxide gel was inserted into the right ear, using the lavage kit the ear was lavaged until clean inspection with a cerumen spoon to make sure residual wax was not present patient tolerated the procedure well.

## 2010-10-04 NOTE — Progress Notes (Signed)
Subjective:    Patient ID: Deanna Watkins, female    DOB: 1961-01-21, 50 y.o.   MRN: [id number]  HPI  this is a 50 year old African American female who was seen by my partner for a keep upper respiratory tract inflammation or infection is determined to possibly be allergic in etiology and she was placed on Nasonex this did not successfully alleviate her symptoms she talk to her pharmacist who recommended that she take an antihistamine with decongestant combination and this has helped dry up some of the symptoms however the underlying disturbing symptom of dizziness has not resolved in this period of time it should be noted that the patient has a history of tendinitis she was evaluated by an ear nose and throat doctor who referred her to a neurologist for further evaluation neither the specialist could come up with an explanation for why she experiences ringing in her right ear.   Review of Systems  Constitutional: Negative for activity change, appetite change and fatigue.  HENT: Positive for ear pain and sinus pressure. Negative for congestion, neck pain and postnasal drip.   Eyes: Negative for redness and visual disturbance.       [ 3 beat nystagmus to right Respiratory: Negative for cough, shortness of breath and wheezing.   Gastrointestinal: Negative for abdominal pain and abdominal distention.  Genitourinary: Negative for dysuria, frequency and menstrual problem.  Musculoskeletal: Negative for myalgias, joint swelling and arthralgias.  Skin: Negative for rash and wound.  Neurological: Positive for dizziness. Negative for weakness and headaches.  Hematological: Negative for adenopathy. Does not bruise/bleed easily.  Psychiatric/Behavioral: Negative for sleep disturbance and decreased concentration.   Past Medical History  Diagnosis Date  . Sickle-cell trait 04/22/2007  . ANEMIA-NOS 04/22/2007  . Headache 07/13/2007  . GERD 04/22/2007  . Menopause   . Sickle cell trait    Past  Surgical History  Procedure Date  . Cholecystectomy   . Abdominal hysterectomy 1999  . Oophorectomy 2006    reports that she has never smoked. She does not have any smokeless tobacco history on file. Her alcohol and drug histories not on file. family history includes Cancer in her mother and sister. No Known Allergies     Objective:   Physical Exam  Constitutional: She is oriented to person, place, and time. She appears well-developed and well-nourished. No distress.  HENT:  Head: Normocephalic and atraumatic.  Nose: Nose normal.  Mouth/Throat: Oropharynx is clear and moist.  Eyes: Conjunctivae and EOM are normal. Pupils are equal, round, and reactive to light.        3 beat nystagmus to the right right canal blocked with wax after lavage the canal was erythematous and there was retraction of the right tympanic membrane  Neck: Normal range of motion. Neck supple. No JVD present. No tracheal deviation present. No thyromegaly present.  Cardiovascular: Normal rate, regular rhythm, normal heart sounds and intact distal pulses.   No murmur heard. Pulmonary/Chest: Effort normal and breath sounds normal. She has no wheezes. She exhibits no tenderness.  Abdominal: Soft. Bowel sounds are normal.  Musculoskeletal: Normal range of motion. She exhibits no edema and no tenderness.  Lymphadenopathy:    She has no cervical adenopathy.  Neurological: She is alert and oriented to person, place, and time. She has normal reflexes. No cranial nerve deficit.  Skin: Skin is warm and dry. She is not diaphoretic.  Psychiatric: She has a normal mood and affect. Her behavior is normal.  Assessment & Plan:   the patient was diagnosed with bilateral wax impaction and otitis media of the right ear this is the possible etiology of the tendinitis as well as the dizziness and her persistent feeling of discomfort in her ear she also has a history of anemia and we will draw a CBC and an iron level to  monitor that should her symptoms of tendinitis not change would refer to audiology for testing

## 2010-10-04 NOTE — Assessment & Plan Note (Signed)
Increased fatigue

## 2010-10-04 NOTE — Assessment & Plan Note (Signed)
After clearing ear of was underlying infection was seen as the cause of the dizzyness

## 2010-10-04 NOTE — Patient Instructions (Signed)
Please continue the Zyrtec D. With the antibiotic until both are finished for 10 days

## 2010-10-07 ENCOUNTER — Telehealth: Payer: Self-pay | Admitting: Internal Medicine

## 2010-10-07 NOTE — Telephone Encounter (Signed)
Pt called req lab results. Pls call back as soon as avail.

## 2010-10-08 NOTE — Telephone Encounter (Signed)
Increase to BID 

## 2010-10-08 NOTE — Telephone Encounter (Signed)
Pt informed iron is 37 and hgb is 10.5--she takes folbee qd--any changes?

## 2010-10-08 NOTE — Telephone Encounter (Signed)
Pt called back again to get lab results. Pls call back asap today.

## 2010-10-08 NOTE — Telephone Encounter (Signed)
Left message on machine for pt 

## 2010-12-06 NOTE — H&P (Signed)
NAME:  Deanna Watkins, Deanna Watkins               ACCOUNT NO.:  [id number]   MEDICAL RECORD NO.:  [id number]          PATIENT TYPE:  AMB   LOCATION:  [facility]                           FACILITY:  [facility]   PHYSICIAN:  Guy Sandifer. Henderson Cloud, M.D. DATE OF BIRTH:  [birth year]   DATE OF ADMISSION:  03/12/2006  DATE OF DISCHARGE:                                HISTORY & PHYSICAL   CHIEF COMPLAINT:  Pelvic cyst.   HISTORY OF PRESENT ILLNESS:  This patient is a 50 year old African-American  female status post postpartum hysterectomy, who has had pain in her left  hip.  An MRI obtained during that evaluation revealed a complex right  ovarian cyst.  Subsequent ultrasound in my office revealed a 1.9 cm cystic  mass medial to the right ovary as well as simple cysts at both ovaries,  measuring 2 cm and less.  Repeat ultrasound on March 05, 2006 revealed  persistence of the cystic mass medial to the ovary as well as cysts on both  ovaries.  The possibility of hydrosalpinx versus persistent ovarian cysts  were discussed with the patient.  After discussion of the options, she is  being admitted for laparoscopy, possibly bilateral salpingectomy, possible  unilateral salpingo-oophorectomy.  Potential risks and complications have  been discussed preoperatively.   PAST MEDICAL HISTORY:  Sickle cell trait.   PAST SURGICAL HISTORY:  Post partum hysterectomy, as above.   MEDICATIONS:  None.   ALLERGIES:  No known drug allergies.   SOCIAL HISTORY:  Denies tobacco, alcohol, or drug abuse.   FAMILY HISTORY:  Positive for history of anemia in mother, thyroid  dysfunction in sister, colon cancer in mother, breast cancer in sister,  breast cancer in maternal grandmother.   REVIEW OF SYSTEMS:  NEURO:  Denies headache.  CARDIAC:  Denies chest pain.  PULMONARY:  Denies shortness of breath.  GI:  Denies recent changes in bowel  habits.   PHYSICAL EXAMINATION:  VITAL SIGNS:  Height 5 feet, 5 inches.  Blood  pressure 112/80.  HEENT:   Without thyromegaly.  LUNGS:  Clear to auscultation.  HEART:  Regular rate and rhythm.  BREASTS:  Not examined.  ABDOMEN:  Soft with mild tenderness to deep palpation of the right lower  quadrant without mass or rebound.  PELVIC:  There is a mildly to moderately tender mass effect high in the  right adnexa, difficult to reach with the examining hand.  Adnexa nontender  without masses.  EXTREMITIES:  Grossly within normal limits.  NEUROLOGIC:  Grossly within normal limits.   ASSESSMENT:  Bilateral pelvic cysts.   PLAN:  Laparoscopy, possible bilateral salpingectomy, possible unilateral  oophorectomy.      Guy Sandifer Henderson Cloud, M.D.  Electronically Signed     JET/MEDQ  D:  03/05/2006  T:  03/05/2006  Job:  [id number]

## 2010-12-06 NOTE — Op Note (Signed)
NAME:  Deanna Watkins, Deanna Watkins               ACCOUNT NO.:  [id number]   MEDICAL RECORD NO.:  [id number]          PATIENT TYPE:  AMB   LOCATION:  [facility]C                           FACILITY:  [facility]   PHYSICIAN:  Guy Sandifer. Henderson Cloud, M.D. DATE OF BIRTH:  January 01, 1961   DATE OF PROCEDURE:  03/12/2006  DATE OF DISCHARGE:                                 OPERATIVE REPORT   PREOPERATIVE DIAGNOSIS:  Bilateral pelvic cyst.   POSTOPERATIVE DIAGNOSIS:  Bilateral pelvic cyst.   PROCEDURE:  Laparoscopy with right salpingo-oophorectomy, resection of left  paratubal cyst x2 and left ovarian biopsy and lysis of adhesions.   SURGEON:  Guy Sandifer. Henderson Cloud, M.D.   ANESTHESIA:  General endotracheal intubation.   ESTIMATED BLOOD LOSS:  Drops.   SPECIMENS:  1. Right tube and ovary.  2. Paratubal cyst x2.  3. Left ovarian biopsy.  All to pathology.   MEDICATIONS AND CONSENT:  This patient is a 50 year old African American  female status post hysterectomy with bilateral pelvic cyst.  Details are  dictated in the history and physical.  Laparoscopy, possible right salpingo-  oophorectomy, possible bilateral salpingectomy have been discussed with the  patient.  Potential risks and complications have been reviewed  preoperatively, including, but not limited to, infection, bowel, bladder or  ureteral damage, bleeding requiring transfusion of blood products with  possible transfusion reaction, HIV and hepatitis acquisition, DVT, PE,  pneumonia, recurrent cyst and/or pelvic pain.  Consent is signed on the  chart.   FINDINGS:  Upper abdomen is grossly normal.  There a few filmy adhesions on  the omentum to the anterior abdominal wall below and to the right of the  umbilicus.  There are multiple filmy adhesions of the omentum over the left  adnexa.  The fallopian tubes have fused in the midline creating one  continuous bridge of tissue.  The right ovary is largely displaced by three  2 cm translucent cysts.  The left ovary  contains a single smooth-walled 3 cm  cyst.  There are two 1 cm left paratubal cysts.   PROCEDURE:  The patient was taken to the operating room where she is  identified, placed in dorsal supine position and general anesthesia was  induced with endotracheal intubation.  She is then placed in dorsal  lithotomy position where she is prepped abdominally and vaginally.  Bladder  straight catheterized.  Ring clamp with a sponge is placed in the vagina and  she is draped in a sterile fashion.  Infraumbilical and suprapubic areas are  infiltrated in the midline with 0.5% plain lidocaine.  An infraumbilical  incision is made.  A disposable Veress needle was placed on the first  attempt without difficulty.  Normal syringe and drop test are noted.  2  liters of gas were insufflated under low pressure with good tympany in the  right upper quadrant.  Veress needle is removed and a 10/11 XL bladeless  disposable trocar sleeve was placed using direct visualization with the  diagnostic laparoscope.  The diagnostic laparoscope was then replaced by the  operative laparoscope.  Small suprapubic incision is made and  a 5-mm XL  bladeless disposable trocar sleeve was placed under direct visualization  without difficulty.  The above findings are noted.  The adhesions over the  left adnexa are taken down sharply without difficulty.  The right ureter is  then noted to be well clear of the area of surgery.  Using the gyrus bipolar  cautery cutting instrument, the right infundibulopelvic ligament is taken  down and followed by the mesosalpinx.  After releasing the right tube and  ovary from its pedicle, it is taken down in the midline from its attachment  to the left fallopian tube and placed in the cul-de-sac.  The left paratubal  cysts are then resected with the bipolar cautery and removed through the  suprapubic port.  Attempts at aspirating the left ovarian cyst revealed  little fluid.  Therefore, a left ovarian  biopsy is carried out removing much  of the cyst wall.  Bipolar cautery is used to obtain complete hemostasis.  Irrigation is carried out.  Inspection under reduced pneumoperitoneum  reveals good hemostasis.  Interceed is backloaded through the laparoscope  and placed over the left tube and ovary.  Then, using a 5-mm laparoscope  through the suprapubic trocar sleeve, a bag is placed through the umbilical  trocar sleeve, and under direct visualization, the right tube and ovary are  retrieved without difficulty through the umbilicus.  Switching back to the  operative laparoscope, inspection again reveals excellent hemostasis.  Suprapubic trocar sleeve was removed.  Pneumoperitoneum is reduced and the  umbilical trocar sleeve was removed.  0-Vicryl suture is used to close the  subcutaneous layer in the umbilical incision.  Dermabond is used to close  the skin on both incisions.  Instruments removed from the vagina.  All  counts are correct.  The patient is awakened and taken to recovery room in  stable condition.      Guy Sandifer Henderson Cloud, M.D.  Electronically Signed     JET/MEDQ  D:  03/12/2006  T:  03/12/2006  Job:  [id number]

## 2010-12-13 ENCOUNTER — Other Ambulatory Visit (INDEPENDENT_AMBULATORY_CARE_PROVIDER_SITE_OTHER): Payer: 59

## 2010-12-13 ENCOUNTER — Encounter: Payer: Self-pay | Admitting: Internal Medicine

## 2010-12-13 ENCOUNTER — Ambulatory Visit (INDEPENDENT_AMBULATORY_CARE_PROVIDER_SITE_OTHER): Payer: 59 | Admitting: Internal Medicine

## 2010-12-13 VITALS — BP 102/80 | Temp 99.0°F | Wt 145.0 lb

## 2010-12-13 DIAGNOSIS — Z0000 Encounter for general adult medical examination without abnormal findings: Secondary | ICD-10-CM

## 2010-12-13 DIAGNOSIS — J029 Acute pharyngitis, unspecified: Secondary | ICD-10-CM

## 2010-12-13 LAB — POCT URINALYSIS DIPSTICK
Bilirubin, UA: NEGATIVE
Blood, UA: NEGATIVE
Glucose, UA: NEGATIVE
Ketones, UA: NEGATIVE
Leukocytes, UA: NEGATIVE
Nitrite, UA: NEGATIVE
Protein, UA: NEGATIVE
Spec Grav, UA: 1.015
Urobilinogen, UA: 0.2
pH, UA: 6

## 2010-12-13 LAB — CBC WITH DIFFERENTIAL/PLATELET
Basophils Absolute: 0 10*3/uL (ref 0.0–0.1)
Basophils Relative: 0.4 % (ref 0.0–3.0)
Eosinophils Absolute: 0 10*3/uL (ref 0.0–0.7)
Eosinophils Relative: 0.8 % (ref 0.0–5.0)
HCT: 31.6 % — ABNORMAL LOW (ref 36.0–46.0)
Hemoglobin: 10.8 g/dL — ABNORMAL LOW (ref 12.0–15.0)
Lymphocytes Relative: 33.1 % (ref 12.0–46.0)
Lymphs Abs: 1.9 10*3/uL (ref 0.7–4.0)
MCHC: 34 g/dL (ref 30.0–36.0)
MCV: 80.3 fl (ref 78.0–100.0)
Monocytes Absolute: 0.4 10*3/uL (ref 0.1–1.0)
Monocytes Relative: 7.2 % (ref 3.0–12.0)
Neutro Abs: 3.3 10*3/uL (ref 1.4–7.7)
Neutrophils Relative %: 58.5 % (ref 43.0–77.0)
Platelets: 156 10*3/uL (ref 150.0–400.0)
RBC: 3.93 Mil/uL (ref 3.87–5.11)
RDW: 14.6 % (ref 11.5–14.6)
WBC: 5.7 10*3/uL (ref 4.5–10.5)

## 2010-12-13 LAB — BASIC METABOLIC PANEL
BUN: 13 mg/dL (ref 6–23)
CO2: 29 mEq/L (ref 19–32)
Calcium: 9.1 mg/dL (ref 8.4–10.5)
Chloride: 110 mEq/L (ref 96–112)
Creatinine, Ser: 0.7 mg/dL (ref 0.4–1.2)
GFR: 116.1 mL/min (ref >60.00)
Glucose, Bld: 84 mg/dL (ref 70–99)
Potassium: 4.2 mEq/L (ref 3.5–5.1)
Sodium: 144 mEq/L (ref 135–145)

## 2010-12-13 LAB — HEPATIC FUNCTION PANEL
ALT: 17 U/L (ref 0–35)
AST: 25 U/L (ref 0–37)
Albumin: 4.2 g/dL (ref 3.5–5.2)
Alkaline Phosphatase: 68 U/L (ref 39–117)
Bilirubin, Direct: 0.2 mg/dL (ref 0.0–0.3)
Total Bilirubin: 1.4 mg/dL — ABNORMAL HIGH (ref 0.3–1.2)
Total Protein: 7.4 g/dL (ref 6.0–8.3)

## 2010-12-13 LAB — POCT RAPID STREP A (OFFICE): Rapid Strep A Screen: NEGATIVE

## 2010-12-13 LAB — LIPID PANEL
Cholesterol: 132 mg/dL (ref 0–200)
HDL: 48.9 mg/dL (ref >39.00)
LDL Cholesterol: 73 mg/dL (ref 0–99)
Total CHOL/HDL Ratio: 3
Triglycerides: 53 mg/dL (ref 0.0–149.0)
VLDL: 10.6 mg/dL (ref 0.0–40.0)

## 2010-12-13 LAB — TSH: TSH: 1.76 u[IU]/mL (ref 0.35–5.50)

## 2010-12-13 MED ORDER — LEVOFLOXACIN 500 MG PO TABS: 500.0000 mg | ORAL_TABLET | Freq: Every day | ORAL | Status: DC

## 2010-12-13 NOTE — Progress Notes (Signed)
patient  Is aware 

## 2010-12-14 ENCOUNTER — Encounter: Payer: Self-pay | Admitting: Internal Medicine

## 2010-12-14 DIAGNOSIS — J029 Acute pharyngitis, unspecified: Secondary | ICD-10-CM | POA: Insufficient documentation

## 2010-12-14 NOTE — Progress Notes (Signed)
  Subjective:    Patient ID: Deanna Watkins, female    DOB: [birth year], 50 y.o.   MRN: [id number]  HPI Patient presents to clinic for evaluation of neck pain. States 5 days ago had a sensation of drainage in her right ear without actual drainage. The next day progressed to right upper lateral neck pain and swelling. Is placed on Augmentin at her workplace and tolerates without adverse effect. Notes persistent swelling and tenderness starting below her right ear extending forward along the jawline. Has associated sore throat without fever chills or difficulty swallowing. No obvious sick exposures. No other alleviating or exacerbating factors. No other complaints.  Reviewed past medical history, medications and allergies    Review of Systems see history of present illness     Objective:   Physical Exam  Nursing note and vitals reviewed. Constitutional: She appears well-developed and well-nourished. No distress.  HENT:  Head: Normocephalic and atraumatic.  Right Ear: Tympanic membrane, external ear and ear canal normal.  Left Ear: Tympanic membrane, external ear and ear canal normal.  Nose: Nose normal.  Mouth/Throat: Mucous membranes are normal. No uvula swelling. Posterior oropharyngeal erythema present. No oropharyngeal exudate, posterior oropharyngeal edema or tonsillar abscesses.  Neck:       Tenderness along the right lower jaw line moving forward to proximally submandibular gland. No parotid gland enlargement. Full range of motion of jaw. No bony abnormality. No masses appreciated. No obvious cervical adenopathy.  Skin: She is not diaphoretic.          Assessment & Plan:

## 2010-12-14 NOTE — Assessment & Plan Note (Signed)
Stop Augmentin. Begin Levaquin. Obtain rapid strep.Followup if no improvement or worsening.

## 2010-12-20 ENCOUNTER — Ambulatory Visit (INDEPENDENT_AMBULATORY_CARE_PROVIDER_SITE_OTHER): Payer: 59 | Admitting: Internal Medicine

## 2010-12-20 ENCOUNTER — Encounter: Payer: Self-pay | Admitting: Internal Medicine

## 2010-12-20 DIAGNOSIS — Z0000 Encounter for general adult medical examination without abnormal findings: Secondary | ICD-10-CM

## 2010-12-20 DIAGNOSIS — D573 Sickle-cell trait: Secondary | ICD-10-CM

## 2010-12-20 MED ORDER — LEVOFLOXACIN 500 MG PO TABS: 500.0000 mg | ORAL_TABLET | Freq: Every day | ORAL | Status: AC

## 2010-12-20 NOTE — Progress Notes (Signed)
  Subjective:    Patient ID: Deanna Watkins, female    DOB: 1960/12/08, 51 y.o.   MRN: [id number]  HPIcpx     Review of Systems  Constitutional: Negative for activity change, appetite change and fatigue.  HENT: Negative for ear pain, congestion, neck pain, postnasal drip and sinus pressure.   Eyes: Negative for redness and visual disturbance.  Respiratory: Negative for cough, shortness of breath and wheezing.   Gastrointestinal: Negative for abdominal pain and abdominal distention.  Genitourinary: Negative for dysuria, frequency and menstrual problem.  Musculoskeletal: Negative for myalgias, joint swelling and arthralgias.  Skin: Negative for rash and wound.  Neurological: Negative for dizziness, weakness and headaches.  Hematological: Negative for adenopathy. Does not bruise/bleed easily.  Psychiatric/Behavioral: Negative for sleep disturbance and decreased concentration.       Objective:   Physical Exam  Constitutional: She is oriented to person, place, and time. She appears well-developed and well-nourished. No distress.  HENT:  Head: Normocephalic and atraumatic.  Right Ear: External ear normal.  Left Ear: External ear normal.  Nose: Nose normal.  Mouth/Throat: Oropharynx is clear and moist.  Eyes: Conjunctivae and EOM are normal. Pupils are equal, round, and reactive to light.  Neck: Normal range of motion. Neck supple. No JVD present. No tracheal deviation present. No thyromegaly present.  Cardiovascular: Normal rate, regular rhythm, normal heart sounds and intact distal pulses.   No murmur heard. Pulmonary/Chest: Effort normal and breath sounds normal. She has no wheezes. She exhibits no tenderness.  Abdominal: Soft. Bowel sounds are normal.  Musculoskeletal: Normal range of motion. She exhibits no edema and no tenderness.  Lymphadenopathy:    She has no cervical adenopathy.  Neurological: She is alert and oriented to person, place, and time. She has normal reflexes.  No cranial nerve deficit.  Skin: Skin is warm and dry. She is not diaphoretic.  Psychiatric: She has a normal mood and affect. Her behavior is normal.          Assessment & Plan:   This is a routine physical examination for this healthy  Female. Reviewed all health maintenance protocols including mammography colonoscopy bone density and reviewed appropriate screening labs. Her immunization history was reviewed as well as her current medications and allergies refills of her chronic medications were given and the plan for yearly health maintenance was discussed all orders and referrals were made as appropriate. Persistent mild parotitis we'll continue Levaquin for 5 additional days and bring about one month for recheck

## 2011-01-16 ENCOUNTER — Encounter: Payer: Self-pay | Admitting: Internal Medicine

## 2011-01-24 ENCOUNTER — Ambulatory Visit: Payer: 59 | Admitting: Internal Medicine

## 2011-02-05 ENCOUNTER — Other Ambulatory Visit: Payer: Self-pay | Admitting: *Deleted

## 2011-02-07 ENCOUNTER — Ambulatory Visit: Payer: 59 | Admitting: Internal Medicine

## 2011-02-17 ENCOUNTER — Ambulatory Visit: Payer: 59 | Admitting: Internal Medicine

## 2011-02-19 ENCOUNTER — Encounter: Payer: Self-pay | Admitting: Internal Medicine

## 2011-02-19 ENCOUNTER — Ambulatory Visit (INDEPENDENT_AMBULATORY_CARE_PROVIDER_SITE_OTHER): Payer: 59 | Admitting: Internal Medicine

## 2011-02-19 VITALS — BP 120/80 | HR 68 | Temp 98.2°F | Resp 14 | Ht 64.5 in | Wt 142.0 lb

## 2011-02-19 DIAGNOSIS — S139XXA Sprain of joints and ligaments of unspecified parts of neck, initial encounter: Secondary | ICD-10-CM

## 2011-02-19 DIAGNOSIS — S161XXA Strain of muscle, fascia and tendon at neck level, initial encounter: Secondary | ICD-10-CM

## 2011-02-19 DIAGNOSIS — D649 Anemia, unspecified: Secondary | ICD-10-CM

## 2011-02-19 LAB — CBC WITH DIFFERENTIAL/PLATELET
Basophils Absolute: 0 10*3/uL (ref 0.0–0.1)
Basophils Relative: 0.6 % (ref 0.0–3.0)
Eosinophils Absolute: 0 10*3/uL (ref 0.0–0.7)
Eosinophils Relative: 0.6 % (ref 0.0–5.0)
HCT: 33.5 % — ABNORMAL LOW (ref 36.0–46.0)
Hemoglobin: 11 g/dL — ABNORMAL LOW (ref 12.0–15.0)
Lymphocytes Relative: 36.2 % (ref 12.0–46.0)
Lymphs Abs: 1.9 10*3/uL (ref 0.7–4.0)
MCHC: 32.8 g/dL (ref 30.0–36.0)
MCV: 79.8 fl (ref 78.0–100.0)
Monocytes Absolute: 0.4 10*3/uL (ref 0.1–1.0)
Monocytes Relative: 7.6 % (ref 3.0–12.0)
Neutro Abs: 3 10*3/uL (ref 1.4–7.7)
Neutrophils Relative %: 55 % (ref 43.0–77.0)
Platelets: 158 10*3/uL (ref 150.0–400.0)
RBC: 4.2 Mil/uL (ref 3.87–5.11)
RDW: 14.5 % (ref 11.5–14.6)
WBC: 5.4 10*3/uL (ref 4.5–10.5)

## 2011-02-19 MED ORDER — CYCLOBENZAPRINE HCL 5 MG PO TABS: 5.0000 mg | ORAL_TABLET | Freq: Three times a day (TID) | ORAL | Status: AC | PRN

## 2011-02-19 NOTE — Progress Notes (Signed)
Subjective:    Patient ID: Deanna Watkins, female    DOB: 1960/07/25, 50 y.o.   MRN: [id number]  HPI Patient presents for followup of cervical adenopathy no painful lumps felt at this time She reports pain in the middle 2 fingers of her right hand. No detectable numbness. No weakness the main symptom is soreness. No tendinitis associated with it. Has been feeling some intermittent pain in the entire right arm and some tingling in the fingers associated with that... Also due monitoring of her complete blood count as per history of anemia and sickle trait    Review of Systems  Constitutional: Negative for activity change, appetite change and fatigue.  HENT: Negative for ear pain, congestion, neck pain, postnasal drip and sinus pressure.   Eyes: Negative for redness and visual disturbance.  Respiratory: Negative for cough, shortness of breath and wheezing.   Gastrointestinal: Negative for abdominal pain and abdominal distention.  Genitourinary: Negative for dysuria, frequency and menstrual problem.  Musculoskeletal: Negative for myalgias, joint swelling and arthralgias.  Skin: Negative for rash and wound.  Neurological: Negative for dizziness, weakness and headaches.  Hematological: Negative for adenopathy. Does not bruise/bleed easily.  Psychiatric/Behavioral: Negative for sleep disturbance and decreased concentration.       Past Medical History  Diagnosis Date  . Sickle-cell trait 04/22/2007  . ANEMIA-NOS 04/22/2007  . Headache 07/13/2007  . GERD 04/22/2007  . Menopause   . Sickle cell trait    Past Surgical History  Procedure Date  . Cholecystectomy   . Abdominal hysterectomy 1999  . Oophorectomy 2006    reports that she has never smoked. She does not have any smokeless tobacco history on file. She reports that she does not drink alcohol or use illicit drugs. family history includes Cancer in her mother and sister. No Known Allergies  Objective:   Physical Exam  Nursing  note and vitals reviewed. Constitutional: She is oriented to person, place, and time. She appears well-developed and well-nourished. No distress.  HENT:  Head: Normocephalic and atraumatic.  Right Ear: External ear normal.  Left Ear: External ear normal.  Nose: Nose normal.  Mouth/Throat: Oropharynx is clear and moist.  Eyes: Conjunctivae and EOM are normal. Pupils are equal, round, and reactive to light.  Neck: Normal range of motion. Neck supple. No JVD present. No tracheal deviation present. No thyromegaly present.  Cardiovascular: Normal rate, regular rhythm, normal heart sounds and intact distal pulses.   No murmur heard. Pulmonary/Chest: Effort normal and breath sounds normal. She has no wheezes. She exhibits no tenderness.  Abdominal: Soft. Bowel sounds are normal.  Musculoskeletal: Normal range of motion. She exhibits no edema and no tenderness.  Lymphadenopathy:    She has no cervical adenopathy.  Neurological: She is alert and oriented to person, place, and time. She has normal reflexes. No cranial nerve deficit.  Skin: Skin is warm and dry. She is not diaphoretic.  Psychiatric: She has a normal mood and affect. Her behavior is normal.          Assessment & Plan:   It is apparent that the patient has a chronic cervical strain syndrome probably due to her her job and the use of computers at work.  We discussed appropriate accommodations at work place to reduce cervical strain exercises that can alleviate cervical strain and the use of ice and heat for the acute pain as well as and muscle relaxants take at bedtime.  She is aware that she will need to contact the  Armed forces training and education officer at work to see if changes to her chair and desk can be made.  The prescription was sent to her pharmacy for Flexeril 5 mg by mouth each bedtime and as needed.  Will monitor CBC differential for her sickle cell and the resultant anemia otherwise she is stable and will have followup  appointment

## 2011-02-19 NOTE — Patient Instructions (Signed)
Cervical and Neck Sprain and Strain (Neck Sprain and Strain) A cervical sprain is an injury to the neck. The injury can include either over-stretching or even small tears in the ligaments that hold the bones of the neck in place. A strain affects muscles and tendons. Minor injuries usually only involve ligaments and muscles. Because the different parts of the neck are so close together, more severe injuries can involve both sprain and strain. These injuries can affect the muscles, ligaments, tendons, discs, and nerves in the neck. SYMPTOMS  Pain, soreness, stiffness, or burning sensation in the front, back, or sides of the neck. This may develop immediately after injury. Onset of discomfort may also develop slowly and not begin for 24 hours or more.   Shoulder and/or upper back pain.   Limits to the normal movement of the neck.   Headache.  Weakness and/or abnormal sensation (such as numbness or tingling) of one or both arms and/or hands.   Muscle spasm. CAUSES An injury may be the result of a direct blow or from certain habits that can lead to the symptoms noted ab  Lifestyle or awkward postures:   Cradling a telephone between the ear and shoulder.   Sitting in a chair that offers no support.   Working at an [employer] manager station.   Activities that require hours of repeated or long periods of looking up (stretching the neck backward) or looking down (bending the head/neck forward).  DIAGNOSIS  Most of the time, your caregiver can diagnose this problem with a careful history and examination. The history will include information about known problems (such as arthritis in the neck) or a previous neck injury. X-rays may be ordered to find out if there is a different problem. X-rays can also help to find problems with the bones of the neck not related to the injury or current symptoms. TREATMENT Several treatment options are available to help pain, spasm, and other symptoms. They  include:  Cold helps relieve pain and reduce inflammation. Cold should be applied for 10 to 15 minutes every 2 to 3 hours after any activity that aggravates your symptoms. Use ice packs or an ice massage. Place a towel or cloth in between your skin and the ice pack.   Medication:   Only take over-the-counter or prescription medicines for pain, discomfort, or fever as directed by your caregiver.   Pain relievers or muscle relaxants may be prescribed. Use only as directed and only as much as you need.   Change in the activity that caused the problem. This might include using a headset with a telephone so that the phone is not propped between your ear and shoulder.     Work station. Changes may be needed in your work place. A better sitting position and/or better posture during work may be part of your treatment.   Physical Therapy. Your caregiver may recommend physical therapy. This can include instructions in the use of stretching and strengthening exercises. Improvement in posture is important. Exercises and posture training can help stabilize the neck and strengthen muscles and keep symptoms from returning.  HOME CARE INSTRUCTIONS  Other than formal physical therapy, all treatments above can be done at home. Even when not at work, it is important to be conscious of your posture and of activities that can cause a return of symptoms. Most cervical sprains and/or strains are better in 1-3 weeks. As you improve and increase activities, doing a warm up and stretching before the activity will  help  prevent recurrent problems.  Place ice for 15 minutes twice a day followed by 15 minutes  Of heat   clock rotation stretching in both direction and shoulder shrugs up and down in the morning and before bed SEEK MEDICAL CARE IF:   Pain is not effectively controlled with medication.   You feel unable to decrease pain medication over time as planned.   Activity level is not improving as planned and/or  expected.  SEEK IMMEDIATE MEDICAL CARE IF:   While using medication, you develop any bleeding, stomach upset, or signs of an allergic reaction.   Symptoms get worse, become intolerable, and are not helped by medications.   New, unexplained symptoms develop.   You experience numbness, tingling, weakness, or paralysis of any part of your body.  MAKE SURE YOU:   Understand these instructions.   Will watch your condition.   Will get help right away if you are not doing well or get worse.  Document Released: 05/04/2007 Document Re-Released: 10/03/2008 [facility] Patient Information 2011 [place], [place].

## 2011-03-27 ENCOUNTER — Inpatient Hospital Stay (INDEPENDENT_AMBULATORY_CARE_PROVIDER_SITE_OTHER)
Admission: RE | Admit: 2011-03-27 | Discharge: 2011-03-27 | Disposition: A | Discharge status: Home / Self Care | Payer: 59 | Source: Ambulatory Visit | Attending: Family Medicine | Admitting: Family Medicine

## 2011-03-27 DIAGNOSIS — R0789 Other chest pain: Secondary | ICD-10-CM

## 2011-04-02 ENCOUNTER — Encounter: Payer: Self-pay | Admitting: Internal Medicine

## 2011-04-02 ENCOUNTER — Ambulatory Visit (INDEPENDENT_AMBULATORY_CARE_PROVIDER_SITE_OTHER): Payer: 59 | Admitting: Internal Medicine

## 2011-04-02 DIAGNOSIS — R0789 Other chest pain: Secondary | ICD-10-CM | POA: Insufficient documentation

## 2011-04-02 DIAGNOSIS — R079 Chest pain, unspecified: Secondary | ICD-10-CM

## 2011-04-02 LAB — CBC
HCT: 30.8 % — ABNORMAL LOW (ref 36.0–46.0)
Hemoglobin: 11.1 g/dL — ABNORMAL LOW (ref 12.0–15.0)
MCH: 26.5 pg (ref 26.0–34.0)
MCHC: 36 g/dL (ref 30.0–36.0)
MCV: 73.5 fL — ABNORMAL LOW (ref 78.0–100.0)
Platelets: 149 10*3/uL — ABNORMAL LOW (ref 150–400)
RBC: 4.19 MIL/uL (ref 3.87–5.11)
RDW: 14 % (ref 11.5–15.5)
WBC: 6.1 10*3/uL (ref 4.0–10.5)

## 2011-04-02 LAB — BASIC METABOLIC PANEL
BUN: 12 mg/dL (ref 6–23)
CO2: 30 mEq/L (ref 19–32)
Calcium: 9 mg/dL (ref 8.4–10.5)
Chloride: 107 mEq/L (ref 96–112)
Creat: 0.8 mg/dL (ref 0.50–1.10)
Glucose, Bld: 93 mg/dL (ref 70–99)
Potassium: 3.7 mEq/L (ref 3.5–5.3)
Sodium: 143 mEq/L (ref 135–145)

## 2011-04-02 LAB — D-DIMER, QUANTITATIVE: D-Dimer, Quant: 0.85 ug/mL-FEU — ABNORMAL HIGH (ref 0.00–0.48)

## 2011-04-02 LAB — TSH: TSH: 1.464 u[IU]/mL (ref 0.350–4.500)

## 2011-04-02 MED ORDER — ESOMEPRAZOLE MAGNESIUM 40 MG PO CPDR: 40.0000 mg | DELAYED_RELEASE_CAPSULE | Freq: Every day | ORAL | Status: DC

## 2011-04-02 NOTE — Assessment & Plan Note (Signed)
Atypical chest pain in 50 year old [place] American female with minimal risk factors. I suspect her symptoms may be coming from GERD vs stress rxn. Start Nexium 40 mg once daily. Rule out other causes Obtain chest x-ray, CBC, BMET, thyroid studies, d-dimer Patient advised to call office if symptoms persist or worsen. Followup with PCP in one - two weeks.

## 2011-04-02 NOTE — Progress Notes (Signed)
Subjective:    Patient ID: Deanna Watkins, female    DOB: Jul 08, 1961, 50 y.o.   MRN: [id number]  HPI 50 year old [place] American female for urgent care followup. Patient was seen one week ago for chest pain. Patient reports her symptoms have been intermittent for the last 2-3 weeks. She describes substernal chest pain. She denies radiation to neck or arm. She reports occasional cough which makes chest pain worse which led her to seek medical care.  Patient reports symptoms recurred today while sitting at work. She works as an Licensed conveyancer. She describes as mild heaviness that turn into "tiny little pains"  She does have history of occasional reflux symptoms and has globus sensation in her lower throat. She has been trying over-the-counter Prilosec x1 week without significant improvement.  She is a nonsmoker. She is not diabetic or hypertensive. She denies family history of premature coronary disease. She denies lower extremity swelling or pain.  She exercises on occasion. She has never had any exertional chest pain. Review of Systems Negative for associated shortness of breath,  occasional heartburn    Past Medical History  Diagnosis Date  . Sickle-cell trait 04/22/2007  . ANEMIA-NOS 04/22/2007  . Headache 07/13/2007  . GERD 04/22/2007  . Menopause   . Sickle cell trait     History   Social History  . Marital Status: Divorced    Spouse Name: N/A    Number of Children: N/A  . Years of Education: N/A   Occupational History  . Not on file.   Social History Main Topics  . Smoking status: Never Smoker   . Smokeless tobacco: Not on file  . Alcohol Use: No  . Drug Use: No  . Sexually Active: Yes   Other Topics Concern  . Not on file   Social History Narrative  . No narrative on file    Past Surgical History  Procedure Date  . Cholecystectomy   . Abdominal hysterectomy 1999  . Oophorectomy 2006    Family History  Problem Relation Age of Onset    . Cancer Mother     colon  . Cancer Sister     breast    No Known Allergies  Current Outpatient Prescriptions on File Prior to Visit  Medication Sig Dispense Refill  . Cetirizine-Pseudoephedrine (ZYRTEC-D PO) Take 10 mg by mouth daily as needed.        . cyclobenzaprine (FLEXERIL) 5 MG tablet Take 1 tablet (5 mg total) by mouth every 8 (eight) hours as needed for muscle spasms (at bed time).  30 tablet  1  . Folic Acid-Vit B6-Vit B12 (FOLBEE) 2.5-25-1 MG TABS Take 1 tablet by mouth 2 (two) times daily.       Marland Kitchen triamcinolone (NASACORT AQ) 55 MCG/ACT nasal inhaler 2 sprays by Nasal route daily.  1 Inhaler  2    BP 114/82  Pulse 83  Temp(Src) 98.3 F (36.8 C) (Oral)  Ht 5\' 3"  (1.6 m)  Wt 145 lb (65.772 kg)  BMI 25.69 kg/m2  SpO2 99%    Objective:   Physical Exam   Constitutional: Appears well-developed and well-nourished. No distress.  Head: Normocephalic and atraumatic.  Right Ear: External ear normal.  Left Ear: External ear normal.  Mouth/Throat: Oropharynx is clear and moist.  Eyes: Conjunctivae are normal. Pupils are equal, round, and reactive to light.  Neck: Normal range of motion. Neck supple. No thyromegaly present. No carotid bruit Cardiovascular: Normal rate, regular rhythm and normal heart sounds.  Exam reveals no gallop and no friction rub.   No murmur heard. Pulmonary/Chest: Effort normal and breath sounds normal.  No wheezes. No rales.  Abdominal: Soft. Mild epigastric tenderness  Neurological: Alert. No cranial nerve deficit.  Skin: Skin is warm and dry.  Psychiatric: Normal mood and affect. Behavior is normal.   EKG from 03/27/2011 reviewed-normal sinus rhythm at 76 beats per minute. No ST changes noted     Assessment & Plan:

## 2011-04-02 NOTE — Patient Instructions (Signed)
Please call our office if your symptoms do not improve or gets worse. Stop all over the counter pain meds

## 2011-04-03 ENCOUNTER — Ambulatory Visit (INDEPENDENT_AMBULATORY_CARE_PROVIDER_SITE_OTHER)
Admission: RE | Admit: 2011-04-03 | Discharge: 2011-04-03 | Disposition: A | Discharge status: Home / Self Care | Payer: 59 | Source: Ambulatory Visit | Attending: Internal Medicine | Admitting: Internal Medicine

## 2011-04-03 DIAGNOSIS — R079 Chest pain, unspecified: Secondary | ICD-10-CM

## 2011-04-04 ENCOUNTER — Ambulatory Visit (INDEPENDENT_AMBULATORY_CARE_PROVIDER_SITE_OTHER)
Admission: RE | Admit: 2011-04-04 | Discharge: 2011-04-04 | Disposition: A | Discharge status: Home / Self Care | Payer: 59 | Source: Ambulatory Visit | Attending: Internal Medicine | Admitting: Internal Medicine

## 2011-04-04 ENCOUNTER — Other Ambulatory Visit: Payer: Self-pay | Admitting: Internal Medicine

## 2011-04-04 DIAGNOSIS — I2699 Other pulmonary embolism without acute cor pulmonale: Secondary | ICD-10-CM

## 2011-04-04 MED ORDER — IOHEXOL 300 MG/ML  SOLN
80.0000 mL | Freq: Once | INTRAMUSCULAR | Status: AC | PRN
  Administered 2011-04-04: 80 mL via INTRAVENOUS

## 2011-05-07 LAB — I-STAT 8, (EC8 V) (CONVERTED LAB)
Acid-Base Excess: 1
BUN: 7
Bicarbonate: 26.2 — ABNORMAL HIGH
Chloride: 108
Glucose, Bld: 86
HCT: 37
Hemoglobin: 12.6
Operator id: 247071
Potassium: 3.5
Sodium: 141
TCO2: 27
pCO2, Ven: 42.4 — ABNORMAL LOW
pH, Ven: 7.399 — ABNORMAL HIGH

## 2011-05-07 LAB — POCT I-STAT CREATININE
Creatinine, Ser: 0.6
Operator id: 247071

## 2012-03-24 ENCOUNTER — Other Ambulatory Visit: Payer: Self-pay | Admitting: Internal Medicine

## 2012-03-29 ENCOUNTER — Ambulatory Visit (INDEPENDENT_AMBULATORY_CARE_PROVIDER_SITE_OTHER): Payer: 59 | Admitting: Family

## 2012-03-29 ENCOUNTER — Encounter: Payer: Self-pay | Admitting: Family

## 2012-03-29 VITALS — BP 112/80 | Temp 98.3°F | Wt 148.0 lb

## 2012-03-29 DIAGNOSIS — R110 Nausea: Secondary | ICD-10-CM

## 2012-03-29 DIAGNOSIS — M898X1 Other specified disorders of bone, shoulder: Secondary | ICD-10-CM

## 2012-03-29 DIAGNOSIS — M899 Disorder of bone, unspecified: Secondary | ICD-10-CM

## 2012-03-29 DIAGNOSIS — M949 Disorder of cartilage, unspecified: Secondary | ICD-10-CM

## 2012-03-29 MED ORDER — MELOXICAM 15 MG PO TABS: 15.0000 mg | ORAL_TABLET | Freq: Every day | ORAL | Status: DC

## 2012-03-29 NOTE — Progress Notes (Signed)
Subjective:    Patient ID: Deanna Watkins, female    DOB: 05-05-1961, 51 y.o.   MRN: [id number]  HPI 51 year old [place] American female, nonsmoker, patient of Dr. Lovell Sheehan is in today with complaints of pain in both collarbones x4 days. She rates pain a 6-7/10, worse with movement. Had a massage on Friday and she believes may have caused her symptoms. Has taken ibuprofen with not much relief. She's also had some nausea, denies vomiting. No abdominal pain, lightheadedness, dizziness, chest pain, palpitations, shortness of breath or edema. She has taken Alka-Seltzer and has gotten relief. Does report an increase in belching.   Review of Systems  Constitutional: Negative.   Respiratory: Negative.   Cardiovascular: Negative.  Negative for chest pain.  Gastrointestinal: Positive for nausea. Negative for vomiting, abdominal pain, diarrhea, constipation, abdominal distention and rectal pain.  Musculoskeletal:       Pain in the collar bones bilaterally. Tender to touch.  Skin: Negative.   Neurological: Negative.   Hematological: Negative.   Psychiatric/Behavioral: Negative.    No family history of Cardiovascular disease or MI.   Past Medical History  Diagnosis Date  . Sickle-cell trait 04/22/2007  . ANEMIA-NOS 04/22/2007  . Headache 07/13/2007  . GERD 04/22/2007  . Menopause   . Sickle cell trait     History   Social History  . Marital Status: Divorced    Spouse Name: N/A    Number of Children: N/A  . Years of Education: N/A   Occupational History  . Not on file.   Social History Main Topics  . Smoking status: Never Smoker   . Smokeless tobacco: Not on file  . Alcohol Use: No  . Drug Use: No  . Sexually Active: Yes   Other Topics Concern  . Not on file   Social History Narrative  . No narrative on file    Past Surgical History  Procedure Date  . Cholecystectomy   . Abdominal hysterectomy 1999  . Oophorectomy 2006    Family History  Problem Relation Age of Onset   . Cancer Mother     colon  . Cancer Sister     breast    No Known Allergies  Current Outpatient Prescriptions on File Prior to Visit  Medication Sig Dispense Refill  . Cetirizine-Pseudoephedrine (ZYRTEC-D PO) Take 10 mg by mouth daily as needed.        Marland Kitchen esomeprazole (NEXIUM) 40 MG capsule Take 1 capsule (40 mg total) by mouth daily before breakfast.  30 capsule  1  . FOLBEE 2.5-25-1 MG TABS TAKE ONE TABLET BY MOUTH TWICE DAILY  60 each  5  . triamcinolone (NASACORT AQ) 55 MCG/ACT nasal inhaler 2 sprays by Nasal route daily.  1 Inhaler  2    BP 112/80  Temp 98.3 F (36.8 C) (Oral)  Wt 148 lb (67.132 kg)chart    Objective:   Physical Exam  Constitutional: She is oriented to person, place, and time. She appears well-developed and well-nourished.  Neck: Normal range of motion. Neck supple.  Cardiovascular: Normal rate, regular rhythm and normal heart sounds.   Pulmonary/Chest: Effort normal and breath sounds normal.  Musculoskeletal: Normal range of motion.       Tenderness to palpation of the left anterior clavicles bilaterally. The left is more tender than the left. No chest wall tenderness. Normal ROM  Neurological: She is alert and oriented to person, place, and time.  Skin: Skin is warm.  Psychiatric: She has a normal mood  and affect.    EKG: WNL       Assessment & Plan:  Assessment: Clavicular pain-noncardiac origin, Nausea, GERD  Plan: Mobic 15mg  once daily. Call the office if symptoms persist. We will consider an xray. OTC Prilosec once daily.

## 2012-05-03 ENCOUNTER — Encounter: Payer: Self-pay | Admitting: Family

## 2012-05-03 ENCOUNTER — Ambulatory Visit (INDEPENDENT_AMBULATORY_CARE_PROVIDER_SITE_OTHER): Payer: 59 | Admitting: Family

## 2012-05-03 VITALS — BP 110/80 | Temp 98.5°F | Wt 148.0 lb

## 2012-05-03 DIAGNOSIS — H9319 Tinnitus, unspecified ear: Secondary | ICD-10-CM

## 2012-05-03 DIAGNOSIS — F430 Acute stress reaction: Secondary | ICD-10-CM

## 2012-05-03 MED ORDER — VENLAFAXINE HCL ER 37.5 MG PO CP24: 37.5000 mg | ORAL_CAPSULE | Freq: Every day | ORAL | Status: DC

## 2012-05-03 NOTE — Patient Instructions (Addendum)

## 2012-05-03 NOTE — Progress Notes (Signed)
Subjective:    Patient ID: Deanna Watkins, female    DOB: Jan 19, 1961, 51 y.o.   MRN: [id number]  HPI 51 year old AAF with c/o of ringing in the ears, nausea, dizziness, and night sweat for 5 years. Pt reports that she has a ringing in the right ear that is constant. The nausea, dizziness and night sweats are intermittent and is manageable. PT was seen my ENT who told pt she was negative for tinnitus, and a neurologist PT reports high level of stress in home life due to divorce 7 years ago, raising her child alone, and sibling moving in with patient. Denies any helplessness or hopelessness, thoughts of death or dying.   Review of Systems  Constitutional: Positive for diaphoresis.  HENT: Positive for tinnitus. Negative for ear pain.   Eyes: Negative.   Respiratory: Negative.   Cardiovascular: Negative.   Gastrointestinal: Positive for nausea.  Genitourinary: Negative.   Musculoskeletal: Negative.   Skin: Negative.   Neurological: Positive for light-headedness and headaches.  Hematological: Negative.   Psychiatric/Behavioral: Negative.    Past Medical History  Diagnosis Date  . Sickle-cell trait 04/22/2007  . ANEMIA-NOS 04/22/2007  . Headache 07/13/2007  . GERD 04/22/2007  . Menopause   . Sickle cell trait     History   Social History  . Marital Status: Divorced    Spouse Name: N/A    Number of Children: N/A  . Years of Education: N/A   Occupational History  . Not on file.   Social History Main Topics  . Smoking status: Never Smoker   . Smokeless tobacco: Not on file  . Alcohol Use: No  . Drug Use: No  . Sexually Active: Yes   Other Topics Concern  . Not on file   Social History Narrative  . No narrative on file    Past Surgical History  Procedure Date  . Cholecystectomy   . Abdominal hysterectomy 1999  . Oophorectomy 2006    Family History  Problem Relation Age of Onset  . Cancer Mother     colon  . Cancer Sister     breast    No Known  Allergies  Current Outpatient Prescriptions on File Prior to Visit  Medication Sig Dispense Refill  . Cetirizine-Pseudoephedrine (ZYRTEC-D PO) Take 10 mg by mouth daily as needed.        Marland Kitchen esomeprazole (NEXIUM) 40 MG capsule Take 1 capsule (40 mg total) by mouth daily before breakfast.  30 capsule  1  . FOLBEE 2.5-25-1 MG TABS TAKE ONE TABLET BY MOUTH TWICE DAILY  60 each  5  . meloxicam (MOBIC) 15 MG tablet Take 1 tablet (15 mg total) by mouth daily.  30 tablet  2  . triamcinolone (NASACORT AQ) 55 MCG/ACT nasal inhaler 2 sprays by Nasal route daily.  1 Inhaler  2  . venlafaxine XR (EFFEXOR XR) 37.5 MG 24 hr capsule Take 1 capsule (37.5 mg total) by mouth daily.  30 capsule  3    BP 110/80  Temp 98.5 F (36.9 C) (Oral)  Wt 148 lb (67.132 kg)chart    Objective:   Physical Exam  Constitutional: She is oriented to person, place, and time. She appears well-developed and well-nourished.  HENT:  Head: Normocephalic.  Right Ear: External ear normal.  Left Ear: External ear normal.  Eyes: Pupils are equal, round, and reactive to light.  Neck: Normal range of motion. Neck supple.  Cardiovascular: Normal rate, regular rhythm and normal heart sounds.  Pulmonary/Chest: Effort normal and breath sounds normal.  Abdominal: Soft. Bowel sounds are normal.  Musculoskeletal: Normal range of motion.  Neurological: She is alert and oriented to person, place, and time.  Skin: Skin is warm and dry.  Psychiatric: She has a normal mood and affect. Her behavior is normal. Judgment and thought content normal.    Past Medical History  Diagnosis Date  . Sickle-cell trait 04/22/2007  . ANEMIA-NOS 04/22/2007  . Headache 07/13/2007  . GERD 04/22/2007  . Menopause   . Sickle cell trait     History   Social History  . Marital Status: Divorced    Spouse Name: N/A    Number of Children: N/A  . Years of Education: N/A   Occupational History  . Not on file.   Social History Main Topics  . Smoking  status: Never Smoker   . Smokeless tobacco: Not on file  . Alcohol Use: No  . Drug Use: No  . Sexually Active: Yes   Other Topics Concern  . Not on file   Social History Narrative  . No narrative on file    Past Surgical History  Procedure Date  . Cholecystectomy   . Abdominal hysterectomy 1999  . Oophorectomy 2006    Family History  Problem Relation Age of Onset  . Cancer Mother     colon  . Cancer Sister     breast    No Known Allergies  Current Outpatient Prescriptions on File Prior to Visit  Medication Sig Dispense Refill  . Cetirizine-Pseudoephedrine (ZYRTEC-D PO) Take 10 mg by mouth daily as needed.        Marland Kitchen esomeprazole (NEXIUM) 40 MG capsule Take 1 capsule (40 mg total) by mouth daily before breakfast.  30 capsule  1  . FOLBEE 2.5-25-1 MG TABS TAKE ONE TABLET BY MOUTH TWICE DAILY  60 each  5  . meloxicam (MOBIC) 15 MG tablet Take 1 tablet (15 mg total) by mouth daily.  30 tablet  2  . triamcinolone (NASACORT AQ) 55 MCG/ACT nasal inhaler 2 sprays by Nasal route daily.  1 Inhaler  2    BP 110/80  Temp 98.5 F (36.9 C) (Oral)  Wt 148 lb (67.132 kg)chart      Assessment & Plan:  Assessment: Tinnitus, Acute Stress Reaction  Plan: I believe the underlying cause of her tinnitus is anxiety. However, since its been ongoing x 5 years, i will send her to the vestibular rehab clinic.  Effexor 37.5 daily Follow up in 3 weeks for recheck of symptoms or sooner if needed.

## 2012-08-25 ENCOUNTER — Ambulatory Visit: Payer: 59 | Admitting: Internal Medicine

## 2012-10-04 ENCOUNTER — Ambulatory Visit (INDEPENDENT_AMBULATORY_CARE_PROVIDER_SITE_OTHER): Payer: 59 | Admitting: Internal Medicine

## 2012-10-04 ENCOUNTER — Encounter: Payer: Self-pay | Admitting: Internal Medicine

## 2012-10-04 VITALS — BP 120/80 | HR 68 | Temp 98.2°F | Wt 149.0 lb

## 2012-10-04 DIAGNOSIS — H9313 Tinnitus, bilateral: Secondary | ICD-10-CM

## 2012-10-04 DIAGNOSIS — H9319 Tinnitus, unspecified ear: Secondary | ICD-10-CM

## 2012-10-04 NOTE — Progress Notes (Signed)
  Subjective:    Patient ID: Deanna Watkins, female    DOB: [birth year], 52 y.o.   MRN: [id number]  HPI 52 year old female who presentsfor followup of tendinitis.  She has tinnitus in her right ear than her left hip.  She has been evaluated in the past by ear nose and throat her to neurology she has had an MRI that was negative.  She has persistent ringing in her ear without hearing loss   Review of Systems  Constitutional: Negative for activity change, appetite change and fatigue.  HENT: Negative for ear pain, congestion, neck pain, postnasal drip and sinus pressure.   Eyes: Negative for redness and visual disturbance.  Respiratory: Negative for cough, shortness of breath and wheezing.   Gastrointestinal: Negative for abdominal pain and abdominal distention.  Genitourinary: Negative for dysuria, frequency and menstrual problem.  Musculoskeletal: Negative for myalgias, joint swelling and arthralgias.  Skin: Negative for rash and wound.  Neurological: Negative for dizziness, weakness and headaches.  Hematological: Negative for adenopathy. Does not bruise/bleed easily.  Psychiatric/Behavioral: Negative for sleep disturbance and decreased concentration.       Objective:   Physical Exam  Nursing note and vitals reviewed. Constitutional: She is oriented to person, place, and time. She appears well-developed and well-nourished. No distress.  HENT:  Head: Normocephalic and atraumatic.  Facial symmetry noted the right tympanic membrane has significant scarring the left tympanic membrane has no visible scarring and a normal cone  Eyes: Conjunctivae and EOM are normal. Pupils are equal, round, and reactive to light.  Neck: Normal range of motion. Neck supple. No JVD present. No tracheal deviation present. No thyromegaly present.  Cardiovascular: Normal rate, regular rhythm, normal heart sounds and intact distal pulses.   No murmur heard. Pulmonary/Chest: Effort normal and breath sounds  normal. She has no wheezes. She exhibits no tenderness.  Abdominal: Soft. Bowel sounds are normal.  Musculoskeletal: Normal range of motion. She exhibits no edema and no tenderness.  Lymphadenopathy:    She has no cervical adenopathy.  Neurological: She is alert and oriented to person, place, and time. She has normal reflexes. No cranial nerve deficit.  Skin: Skin is warm and dry. She is not diaphoretic.  Psychiatric: She has a normal mood and affect. Her behavior is normal.          Assessment & Plan:  Please refer hearing center and evaluate her for feedbacktherapy

## 2012-10-05 ENCOUNTER — Emergency Department (HOSPITAL_BASED_OUTPATIENT_CLINIC_OR_DEPARTMENT_OTHER): Payer: 59

## 2012-10-05 ENCOUNTER — Encounter (HOSPITAL_BASED_OUTPATIENT_CLINIC_OR_DEPARTMENT_OTHER): Payer: Self-pay | Admitting: *Deleted

## 2012-10-05 ENCOUNTER — Emergency Department (HOSPITAL_BASED_OUTPATIENT_CLINIC_OR_DEPARTMENT_OTHER)
Admission: EM | Admit: 2012-10-05 | Discharge: 2012-10-05 | Disposition: A | Discharge status: Home / Self Care | Payer: 59 | Attending: Emergency Medicine | Admitting: Emergency Medicine

## 2012-10-05 DIAGNOSIS — R209 Unspecified disturbances of skin sensation: Secondary | ICD-10-CM | POA: Insufficient documentation

## 2012-10-05 DIAGNOSIS — IMO0001 Reserved for inherently not codable concepts without codable children: Secondary | ICD-10-CM

## 2012-10-05 DIAGNOSIS — Z862 Personal history of diseases of the blood and blood-forming organs and certain disorders involving the immune mechanism: Secondary | ICD-10-CM | POA: Insufficient documentation

## 2012-10-05 DIAGNOSIS — F411 Generalized anxiety disorder: Secondary | ICD-10-CM | POA: Insufficient documentation

## 2012-10-05 DIAGNOSIS — Z8719 Personal history of other diseases of the digestive system: Secondary | ICD-10-CM | POA: Insufficient documentation

## 2012-10-05 DIAGNOSIS — Z8669 Personal history of other diseases of the nervous system and sense organs: Secondary | ICD-10-CM | POA: Insufficient documentation

## 2012-10-05 NOTE — ED Provider Notes (Signed)
History     CSN: [id number]  Arrival date & time 10/05/12  1931   First MD Initiated Contact with Patient 10/05/12 1941      Chief Complaint  Patient presents with  . Numbness    (Consider location/radiation/quality/duration/timing/severity/associated sxs/prior treatment) The history is provided by the patient.  Deanna Watkins is a 52 y.o. female history of GERD, sickle cell trait here presenting with right facial numbness. She had some ringing in her years for several years it got worse yesterday. So her doctor yesterday and was referred to an audiologist. Today about one hour ago she felt that there was some numbing sensation on the right side of her face that went across to the left side of her forehead. Denies any numbness or weakness in the extremities. Denies any chest pains or shortness of breath. Went to urgent care and was sent in for rule out TIA.   Past Medical History  Diagnosis Date  . Sickle-cell trait 04/22/2007  . ANEMIA-NOS 04/22/2007  . Headache 07/13/2007  . GERD 04/22/2007  . Menopause   . Sickle cell trait     Past Surgical History  Procedure Laterality Date  . Cholecystectomy    . Abdominal hysterectomy  1999  . Oophorectomy  2006    Family History  Problem Relation Age of Onset  . Cancer Mother     colon  . Cancer Sister     breast    History  Substance Use Topics  . Smoking status: Never Smoker   . Smokeless tobacco: Not on file  . Alcohol Use: No    OB History   Grav Para Term Preterm Abortions TAB SAB Ect Mult Living                  Review of Systems  Neurological:       Paresthesias   All other systems reviewed and are negative.    Allergies  Review of patient's allergies indicates no known allergies.  Home Medications   Current Outpatient Rx  Name  Route  Sig  Dispense  Refill  . EXPIRED: esomeprazole (NEXIUM) 40 MG capsule   Oral   Take 1 capsule (40 mg total) by mouth daily before breakfast.   30 capsule   1   .  FOLBEE 2.5-25-1 MG TABS      TAKE ONE TABLET BY MOUTH TWICE DAILY   60 each   5   . EXPIRED: triamcinolone (NASACORT AQ) 55 MCG/ACT nasal inhaler   Nasal   2 sprays by Nasal route daily.   1 Inhaler   2     BP 130/78  Pulse 80  Temp(Src) 97.9 F (36.6 C) (Oral)  Resp 20  Wt 149 lb (67.586 kg)  BMI 26.4 kg/m2  SpO2 100%  Physical Exam  Nursing note and vitals reviewed. Constitutional: She is oriented to person, place, and time. She appears well-developed and well-nourished.  Slightly anxious   HENT:  Head: Normocephalic.  Right Ear: External ear normal.  Left Ear: External ear normal.  Mouth/Throat: Oropharynx is clear and moist.  Eyes: Conjunctivae are normal. Pupils are equal, round, and reactive to light.  Neck: Normal range of motion. Neck supple.  Cardiovascular: Normal rate, regular rhythm and normal heart sounds.   Pulmonary/Chest: Effort normal and breath sounds normal. No respiratory distress. She has no wheezes. She has no rales.  Abdominal: Soft. Bowel sounds are normal. She exhibits no distension. There is no tenderness. There is no rebound and  no guarding.  Musculoskeletal: Normal range of motion. She exhibits no edema and no tenderness.  Neurological: She is alert and oriented to person, place, and time.  CN 2-12 intact. Nl finger to nose. No pronator drift. NL gait.   Skin: Skin is warm and dry.  Psychiatric: She has a normal mood and affect. Her behavior is normal. Judgment and thought content normal.    ED Course  Procedures (including critical care time)  Labs Reviewed - No data to display Ct Head Wo Contrast  10/05/2012  *RADIOLOGY REPORT*  Clinical Data: Right-sided numbness.  CT HEAD WITHOUT CONTRAST  Technique:  Contiguous axial images were obtained from the base of the skull through the vertex without contrast.  Comparison: None.  Findings: Normal appearing cerebral hemispheres and posterior fossa structures.  Normal size and position of the  ventricles.  No intracranial hemorrhage, mass lesion or CT evidence of acute infarction.  Normal appearing bones.  Right maxillary sinus retention cyst or opacified accessory air cell.  IMPRESSION: No intracranial abnormality.   Original Report Authenticated By: Beckie Salts, M.D.      No diagnosis found.    MDM  Deanna Watkins is a 52 y.o. female here with R facial paresthesias, sent for r/o TIA. Sensation now normal, neuro exam unremarkable. Will do CT head. But I have low suspicion for TIA or stroke. If CT nl, she can f/u outpatient with neuro for further workup.   8:02 PM CT head nl. Will refer to neuro for outpatient workup.        Richardean Canal, MD 10/05/12 2003

## 2012-10-05 NOTE — ED Notes (Signed)
Patient transported to CT 

## 2012-10-05 NOTE — ED Notes (Signed)
Tingling sensation to her face about an hour ago. [facility] and told to come to the ED.  Drove herself. No facial droop. Ambulatory, moves all extremities.

## 2012-10-06 ENCOUNTER — Telehealth: Payer: Self-pay | Admitting: Internal Medicine

## 2012-10-06 NOTE — Telephone Encounter (Signed)
Patient Information:  Caller Name: Genee  Phone: [phone]  Patient: Deanna Watkins, Deanna Watkins  Gender: Female  DOB: 05/25/1961  Age: 52 Years  PCP: Darryll Capers (Adults only)  Pregnant: No  Office Follow Up:  Does the office need to follow up with this patient?: No  Instructions For The Office: N/A   Symptoms  Reason For Call & Symptoms: Pt calling for F/U visit from ED [facility].  Call transferred to the office to schedule appt., spoke with Nelva Bush.  Reviewed Health History In EMR: N/A  Reviewed Medications In EMR: N/A  Reviewed Allergies In EMR: N/A  Reviewed Surgeries / Procedures: N/A  Date of Onset of Symptoms: 10/06/2012 OB / GYN:  LMP: Unknown  Guideline(s) Used:  No Protocol Available - Information Only  Disposition Per Guideline:   Home Care  Reason For Disposition Reached:   Information only question and nurse able to answer  Advice Given:  N/A  Patient Will Follow Care Advice:  YES

## 2012-10-11 ENCOUNTER — Encounter: Payer: Self-pay | Admitting: Family

## 2012-10-11 ENCOUNTER — Ambulatory Visit (INDEPENDENT_AMBULATORY_CARE_PROVIDER_SITE_OTHER): Payer: 59 | Admitting: Family

## 2012-10-11 VITALS — BP 110/80 | HR 87 | Wt 148.0 lb

## 2012-10-11 DIAGNOSIS — H9319 Tinnitus, unspecified ear: Secondary | ICD-10-CM

## 2012-10-11 DIAGNOSIS — F430 Acute stress reaction: Secondary | ICD-10-CM

## 2012-10-11 DIAGNOSIS — H9311 Tinnitus, right ear: Secondary | ICD-10-CM

## 2012-10-11 NOTE — Progress Notes (Signed)
Subjective:    Patient ID: Deanna Watkins, female    DOB: 02-Aug-1960, 52 y.o.   MRN: [id number]  HPI 52 year old African American female, nonsmoker, patient of Dr. Lovell Sheehan is in today as an emergency department followup. She was seen in the emergency department on 10/05/2012 after experiencing right-sided facial numbness and tingling of sudden onset that later spread to the left side of her face. Patient reports experiencing the symptoms shortly after finding out that her sister was diagnosed with breast cancer and was having to have a Port-A-Cath placed. She has had a long history at this point of ringing in her years and has been referred to the vestibular rehabilitation and an audiologist and she has not follow up on those visits. It was suggested last year that she consider taking Effexor to help stabilize her mood and anxiety but she elected not to fill it. Last mammogram was done per gynecology and 2013 and normal.   Review of Systems  Constitutional: Negative.   HENT: Positive for tinnitus. Negative for nosebleeds and congestion.   Eyes: Negative.   Respiratory: Negative.   Cardiovascular: Negative.   Skin: Negative.   Neurological: Negative.   Hematological: Negative.   Psychiatric/Behavioral: Negative for sleep disturbance and self-injury. The patient is nervous/anxious.    Past Medical History  Diagnosis Date  . Sickle-cell trait 04/22/2007  . ANEMIA-NOS 04/22/2007  . Headache 07/13/2007  . GERD 04/22/2007  . Menopause   . Sickle cell trait     History   Social History  . Marital Status: Divorced    Spouse Name: N/A    Number of Children: N/A  . Years of Education: N/A   Occupational History  . Not on file.   Social History Main Topics  . Smoking status: Never Smoker   . Smokeless tobacco: Not on file  . Alcohol Use: No  . Drug Use: No  . Sexually Active: Yes   Other Topics Concern  . Not on file   Social History Narrative  . No narrative on file    Past  Surgical History  Procedure Laterality Date  . Cholecystectomy    . Abdominal hysterectomy  1999  . Oophorectomy  2006    Family History  Problem Relation Age of Onset  . Cancer Mother     colon  . Cancer Sister     breast    No Known Allergies  Current Outpatient Prescriptions on File Prior to Visit  Medication Sig Dispense Refill  . FOLBEE 2.5-25-1 MG TABS TAKE ONE TABLET BY MOUTH TWICE DAILY  60 each  5  . esomeprazole (NEXIUM) 40 MG capsule Take 1 capsule (40 mg total) by mouth daily before breakfast.  30 capsule  1  . triamcinolone (NASACORT AQ) 55 MCG/ACT nasal inhaler 2 sprays by Nasal route daily.  1 Inhaler  2   No current facility-administered medications on file prior to visit.    BP 110/80  Pulse 87  Wt 148 lb (67.132 kg)  BMI 26.22 kg/m2  SpO2 98%chart    Objective:   Physical Exam  Constitutional: She is oriented to person, place, and time. She appears well-developed and well-nourished.  HENT:  Right Ear: External ear normal.  Left Ear: External ear normal.  Nose: Nose normal.  Mouth/Throat: Oropharynx is clear and moist.  Neck: Normal range of motion. Neck supple.  Cardiovascular: Normal rate, regular rhythm and normal heart sounds.   Pulmonary/Chest: Effort normal and breath sounds normal.  Abdominal: Soft. Bowel sounds  are normal.  Musculoskeletal: Normal range of motion.  Neurological: She is alert and oriented to person, place, and time.  Skin: Skin is warm and dry.  Psychiatric: She has a normal mood and affect.          Assessment & Plan:  Assessment:  1. Tendinitis 2. Right-sided facial numbness-resolved 3. Anxiety  Plan: Discussed at length her options with regard to her anxiety medication and she's not quite sure that this which she wants to do at this point. She will try B12 over-the-counter she has agreed to let me know if she will consider Effexor or something similar to help her anxiety. Recheck as scheduled and sooner as  needed.

## 2012-10-11 NOTE — Patient Instructions (Addendum)
B12 5000 MCG DAILY  Anxiety and Panic Attacks Your caregiver has informed you that you are having an anxiety or panic attack. There may be many forms of this. Most of the time these attacks come suddenly and without warning. They come at any time of day, including periods of sleep, and at any time of life. They may be strong and unexplained. Although panic attacks are very scary, they are physically harmless. Sometimes the cause of your anxiety is not known. Anxiety is a protective mechanism of the body in its fight or flight mechanism. Most of these perceived danger situations are actually nonphysical situations (such as anxiety over losing a job). CAUSES  The causes of an anxiety or panic attack are many. Panic attacks may occur in otherwise healthy people given a certain set of circumstances. There may be a genetic cause for panic attacks. Some medications may also have anxiety as a side effect. SYMPTOMS  Some of the most common feelings are:  Intense terror.  Dizziness, feeling faint.  Hot and cold flashes.  Fear of going crazy.  Feelings that nothing is real.  Sweating.  Shaking.  Chest pain or a fast heartbeat (palpitations).  Smothering, choking sensations.  Feelings of impending doom and that death is near.  Tingling of extremities, this may be from over-breathing.  Altered reality (derealization).  Being detached from yourself (depersonalization). Several symptoms can be present to make up anxiety or panic attacks. DIAGNOSIS  The evaluation by your caregiver will depend on the type of symptoms you are experiencing. The diagnosis of anxiety or panic attack is made when no physical illness can be determined to be a cause of the symptoms. TREATMENT  Treatment to prevent anxiety and panic attacks may include:  Avoidance of circumstances that cause anxiety.  Reassurance and relaxation.  Regular exercise.  Relaxation therapies, such as yoga.  Psychotherapy with a  psychiatrist or therapist.  Avoidance of caffeine, alcohol and illegal drugs.  Prescribed medication. SEEK IMMEDIATE MEDICAL CARE IF:   You experience panic attack symptoms that are different than your usual symptoms.  You have any worsening or concerning symptoms. Document Released: 07/07/2005 Document Revised: 09/29/2011 Document Reviewed: 11/08/2009 [facility] Patient Information 2013 [place], [place].

## 2012-10-19 ENCOUNTER — Encounter: Payer: Self-pay | Admitting: Family

## 2012-10-25 ENCOUNTER — Ambulatory Visit: Payer: 59 | Admitting: Internal Medicine

## 2012-12-29 ENCOUNTER — Other Ambulatory Visit: Payer: 59

## 2013-01-20 ENCOUNTER — Other Ambulatory Visit: Payer: Self-pay

## 2013-01-20 ENCOUNTER — Other Ambulatory Visit: Payer: Self-pay | Admitting: Obstetrics and Gynecology

## 2013-01-20 DIAGNOSIS — Z1231 Encounter for screening mammogram for malignant neoplasm of breast: Secondary | ICD-10-CM

## 2013-01-28 ENCOUNTER — Other Ambulatory Visit (INDEPENDENT_AMBULATORY_CARE_PROVIDER_SITE_OTHER): Payer: 59

## 2013-01-28 ENCOUNTER — Other Ambulatory Visit: Payer: 59

## 2013-01-28 DIAGNOSIS — Z0000 Encounter for general adult medical examination without abnormal findings: Secondary | ICD-10-CM

## 2013-01-28 LAB — CBC WITH DIFFERENTIAL/PLATELET
Basophils Absolute: 0 10*3/uL (ref 0.0–0.1)
Basophils Relative: 0.3 % (ref 0.0–3.0)
Eosinophils Absolute: 0 10*3/uL (ref 0.0–0.7)
Eosinophils Relative: 0.4 % (ref 0.0–5.0)
HCT: 32.9 % — ABNORMAL LOW (ref 36.0–46.0)
Hemoglobin: 10.9 g/dL — ABNORMAL LOW (ref 12.0–15.0)
Lymphocytes Relative: 33.8 % (ref 12.0–46.0)
Lymphs Abs: 1.9 10*3/uL (ref 0.7–4.0)
MCHC: 33.2 g/dL (ref 30.0–36.0)
MCV: 80.1 fl (ref 78.0–100.0)
Monocytes Absolute: 0.5 10*3/uL (ref 0.1–1.0)
Monocytes Relative: 8.5 % (ref 3.0–12.0)
Neutro Abs: 3.1 10*3/uL (ref 1.4–7.7)
Neutrophils Relative %: 57 % (ref 43.0–77.0)
Platelets: 171 10*3/uL (ref 150.0–400.0)
RBC: 4.11 Mil/uL (ref 3.87–5.11)
RDW: 14.9 % — ABNORMAL HIGH (ref 11.5–14.6)
WBC: 5.5 10*3/uL (ref 4.5–10.5)

## 2013-01-28 LAB — BASIC METABOLIC PANEL
BUN: 10 mg/dL (ref 6–23)
CO2: 26 mEq/L (ref 19–32)
Calcium: 9.3 mg/dL (ref 8.4–10.5)
Chloride: 109 mEq/L (ref 96–112)
Creatinine, Ser: 0.6 mg/dL (ref 0.4–1.2)
GFR: 127.85 mL/min (ref >60.00)
Glucose, Bld: 78 mg/dL (ref 70–99)
Potassium: 3.9 mEq/L (ref 3.5–5.1)
Sodium: 141 mEq/L (ref 135–145)

## 2013-01-28 LAB — POCT URINALYSIS DIPSTICK
Bilirubin, UA: NEGATIVE
Blood, UA: NEGATIVE
Glucose, UA: NEGATIVE
Ketones, UA: NEGATIVE
Leukocytes, UA: NEGATIVE
Nitrite, UA: NEGATIVE
Protein, UA: NEGATIVE
Spec Grav, UA: 1.015
Urobilinogen, UA: 4
pH, UA: 7

## 2013-01-28 LAB — HEPATIC FUNCTION PANEL
ALT: 19 U/L (ref 0–35)
AST: 22 U/L (ref 0–37)
Albumin: 4.4 g/dL (ref 3.5–5.2)
Alkaline Phosphatase: 66 U/L (ref 39–117)
Bilirubin, Direct: 0.2 mg/dL (ref 0.0–0.3)
Total Bilirubin: 1.6 mg/dL — ABNORMAL HIGH (ref 0.3–1.2)
Total Protein: 8.1 g/dL (ref 6.0–8.3)

## 2013-01-28 LAB — LIPID PANEL
Cholesterol: 147 mg/dL (ref 0–200)
HDL: 45.8 mg/dL (ref >39.00)
LDL Cholesterol: 83 mg/dL (ref 0–99)
Total CHOL/HDL Ratio: 3
Triglycerides: 92 mg/dL (ref 0.0–149.0)
VLDL: 18.4 mg/dL (ref 0.0–40.0)

## 2013-01-28 LAB — TSH: TSH: 1.64 u[IU]/mL (ref 0.35–5.50)

## 2013-02-04 ENCOUNTER — Encounter: Payer: Self-pay | Admitting: Internal Medicine

## 2013-02-04 ENCOUNTER — Ambulatory Visit (INDEPENDENT_AMBULATORY_CARE_PROVIDER_SITE_OTHER): Payer: 59 | Admitting: Internal Medicine

## 2013-02-04 VITALS — BP 124/70 | HR 72 | Temp 98.2°F | Resp 16 | Ht 63.0 in | Wt 150.0 lb

## 2013-02-04 DIAGNOSIS — Z0000 Encounter for general adult medical examination without abnormal findings: Secondary | ICD-10-CM

## 2013-02-04 NOTE — Progress Notes (Signed)
Subjective:    Patient ID: Deanna Watkins, female    DOB: 10-29-1960, 52 y.o.   MRN: [id number]  HPI  Complete physical examination this 52 year old female who is followed for history of anemia secondary to [place] trait  Review of Systems  Constitutional: Negative for activity change, appetite change and fatigue.  HENT: Negative for ear pain, congestion, neck pain, postnasal drip and sinus pressure.   Eyes: Negative for redness and visual disturbance.  Respiratory: Negative for cough, shortness of breath and wheezing.   Gastrointestinal: Negative for abdominal pain and abdominal distention.  Genitourinary: Negative for dysuria, frequency and menstrual problem.  Musculoskeletal: Negative for myalgias, joint swelling and arthralgias.  Skin: Negative for rash and wound.  Neurological: Negative for dizziness, weakness and headaches.  Hematological: Negative for adenopathy. Does not bruise/bleed easily.  Psychiatric/Behavioral: Negative for sleep disturbance and decreased concentration.   Past Medical History  Diagnosis Date  . Sickle-cell trait 04/22/2007  . ANEMIA-NOS 04/22/2007  . Headache(784.0) 07/13/2007  . GERD 04/22/2007  . Menopause   . Sickle cell trait     History   Social History  . Marital Status: Divorced    Spouse Name: N/A    Number of Children: N/A  . Years of Education: N/A   Occupational History  . Not on file.   Social History Main Topics  . Smoking status: Never Smoker   . Smokeless tobacco: Not on file  . Alcohol Use: No  . Drug Use: No  . Sexually Active: Yes   Other Topics Concern  . Not on file   Social History Narrative  . No narrative on file    Past Surgical History  Procedure Laterality Date  . Cholecystectomy    . Abdominal hysterectomy  1999  . Oophorectomy  2006    Family History  Problem Relation Age of Onset  . Cancer Mother     colon  . Cancer Sister     breast    No Known Allergies  Current Outpatient Prescriptions on  File Prior to Visit  Medication Sig Dispense Refill  . FOLBEE 2.5-25-1 MG TABS TAKE ONE TABLET BY MOUTH TWICE DAILY  60 each  5   No current facility-administered medications on file prior to visit.    BP 124/70  Pulse 72  Temp(Src) 98.2 F (36.8 C)  Resp 16  Ht 5\' 3"  (1.6 m)  Wt 150 lb (68.04 kg)  BMI 26.58 kg/m2        Objective:   Physical Exam  Nursing note and vitals reviewed. Constitutional: She is oriented to person, place, and time. She appears well-developed and well-nourished. No distress.  HENT:  Head: Normocephalic and atraumatic.  Right Ear: External ear normal.  Left Ear: External ear normal.  Nose: Nose normal.  Mouth/Throat: Oropharynx is clear and moist.  Eyes: Conjunctivae and EOM are normal. Pupils are equal, round, and reactive to light.  Neck: Normal range of motion. Neck supple. No JVD present. No tracheal deviation present. No thyromegaly present.  Cardiovascular: Normal rate, regular rhythm, normal heart sounds and intact distal pulses.   No murmur heard. Pulmonary/Chest: Effort normal and breath sounds normal. She has no wheezes. She exhibits no tenderness.  Abdominal: Soft. Bowel sounds are normal.  Musculoskeletal: Normal range of motion. She exhibits no edema and no tenderness.  Lymphadenopathy:    She has no cervical adenopathy.  Neurological: She is alert and oriented to person, place, and time. She has normal reflexes. No cranial nerve deficit.  Skin: Skin is warm and dry. She is not diaphoretic.  Psychiatric: She has a normal mood and affect. Her behavior is normal.          Assessment & Plan:   Patient presents for yearly preventative medicine examination.   all immunizations and health maintenance protocols were reviewed with the patient and they are up to date with these protocols.   screening laboratory values were reviewed with the patient including screening of hyperlipidemia PSA renal function and hepatic function.   There  medications past medical history social history problem list and allergies were reviewed in detail.   Goals were established with regard to weight loss exercise diet in compliance with medications

## 2013-02-14 ENCOUNTER — Ambulatory Visit: Payer: 59

## 2013-02-18 ENCOUNTER — Encounter: Payer: Self-pay | Admitting: Internal Medicine

## 2013-03-01 ENCOUNTER — Ambulatory Visit
Admission: RE | Admit: 2013-03-01 | Discharge: 2013-03-01 | Disposition: A | Discharge status: Home / Self Care | Payer: 59 | Source: Ambulatory Visit

## 2013-03-01 DIAGNOSIS — Z1231 Encounter for screening mammogram for malignant neoplasm of breast: Secondary | ICD-10-CM

## 2013-04-04 ENCOUNTER — Other Ambulatory Visit: Payer: Self-pay | Admitting: Internal Medicine

## 2013-04-14 ENCOUNTER — Encounter: Payer: Self-pay | Admitting: Family

## 2013-04-19 ENCOUNTER — Ambulatory Visit (INDEPENDENT_AMBULATORY_CARE_PROVIDER_SITE_OTHER): Payer: 59 | Admitting: Family Medicine

## 2013-04-19 ENCOUNTER — Encounter: Payer: Self-pay | Admitting: Family Medicine

## 2013-04-19 VITALS — BP 110/80 | Temp 99.4°F | Wt 151.0 lb

## 2013-04-19 DIAGNOSIS — W57XXXA Bitten or stung by nonvenomous insect and other nonvenomous arthropods, initial encounter: Secondary | ICD-10-CM

## 2013-04-19 DIAGNOSIS — T148 Other injury of unspecified body region: Secondary | ICD-10-CM

## 2013-04-19 NOTE — Progress Notes (Signed)
Chief Complaint  Patient presents with  . Insect Bite    HPI:  Acute visit for bug bite: -stung by a be or bug 1-2 weeks ago on R lower abd - had itchy bump now resolving but still can feel it at times -denies: fevers, chills, drainage, worsening  ROS: See pertinent positives and negatives per HPI.  Past Medical History  Diagnosis Date  . Sickle-cell trait 04/22/2007  . ANEMIA-NOS 04/22/2007  . Headache(784.0) 07/13/2007  . GERD 04/22/2007  . Menopause   . Sickle cell trait     Past Surgical History  Procedure Laterality Date  . Cholecystectomy    . Abdominal hysterectomy  1999  . Oophorectomy  2006    Family History  Problem Relation Age of Onset  . Cancer Mother     colon  . Cancer Sister     breast    History   Social History  . Marital Status: Divorced    Spouse Name: N/A    Number of Children: N/A  . Years of Education: N/A   Social History Main Topics  . Smoking status: Never Smoker   . Smokeless tobacco: None  . Alcohol Use: No  . Drug Use: No  . Sexual Activity: Yes   Other Topics Concern  . None   Social History Narrative  . None    Current outpatient prescriptions:VIRT-VITE 2.5-25-1 MG TABS tablet, Take one tablet by mouth twice daily, Disp: 60 tablet, Rfl: 4  EXAM:  Filed Vitals:   04/19/13 0920  BP: 110/80  Temp: 99.4 F (37.4 C)    Body mass index is 26.76 kg/(m^2).  GENERAL: vitals reviewed and listed above, alert, oriented, appears well hydrated and in no acute distress  HEENT: atraumatic, conjunttiva clear, no obvious abnormalities on inspection of external nose and ears  NECK: no obvious masses on inspection  SKIN: she has marked area with a pen, extremely small hyperpig macule, otherwise normal  MS: moves all extremities without noticeable abnormality  PSYCH: pleasant and cooperative, no obvious depression or anxiety  ASSESSMENT AND PLAN:  Discussed the following assessment and plan:  Bug bite  -appears to be  healed area from insect bite and can barely see it. Advised can try topical hydrocortisone for mild residual itch for 1 week. Follow up if any persistent concerns in 1-2 weeks. -Patient advised to return or notify a doctor immediately if symptoms worsen or persist or new concerns arise.  There are no Patient Instructions on file for this visit.   Kriste Basque R.

## 2013-05-26 ENCOUNTER — Other Ambulatory Visit: Payer: Self-pay

## 2013-07-29 ENCOUNTER — Encounter: Payer: Self-pay | Admitting: Family Medicine

## 2013-07-29 ENCOUNTER — Ambulatory Visit (INDEPENDENT_AMBULATORY_CARE_PROVIDER_SITE_OTHER): Payer: 59 | Admitting: Family Medicine

## 2013-07-29 VITALS — BP 110/80 | HR 97 | Temp 98.0°F | Wt 149.0 lb

## 2013-07-29 DIAGNOSIS — G5600 Carpal tunnel syndrome, unspecified upper limb: Secondary | ICD-10-CM

## 2013-07-29 DIAGNOSIS — J329 Chronic sinusitis, unspecified: Secondary | ICD-10-CM

## 2013-07-29 DIAGNOSIS — M25511 Pain in right shoulder: Secondary | ICD-10-CM

## 2013-07-29 DIAGNOSIS — G5601 Carpal tunnel syndrome, right upper limb: Secondary | ICD-10-CM

## 2013-07-29 DIAGNOSIS — M25519 Pain in unspecified shoulder: Secondary | ICD-10-CM

## 2013-07-29 MED ORDER — AMOXICILLIN 875 MG PO TABS: 875.0000 mg | ORAL_TABLET | Freq: Two times a day (BID) | ORAL | Status: DC

## 2013-07-29 NOTE — Patient Instructions (Signed)
INSTRUCTIONS FOR UPPER RESPIRATORY INFECTION:  -plenty of rest and fluids  -nasal saline wash 2-3 times daily (use prepackaged nasal saline or bottled/distilled water if making your own)   -As we discussed, we have prescribed a new medication for you at this appointment. We discussed the common and serious potential adverse effects of this medication and you can review these and more with the pharmacist when you pick up your medication.  Please follow the instructions for use carefully and notify us immediately if you have any problems taking this medication.  -can use sinex nasal spray for drainage and nasal congestion - but do NOT use longer then 3-4 days  -can use tylenol or ibuprofen as directed for aches and sorethroat  -in the winter time, using a humidifier at night is helpful (please follow cleaning instructions)  -if you are taking a cough medication - use only as directed, may also try a teaspoon of honey to coat the throat and throat lozenges  -for sore throat, salt water gargles can help  -follow up if you have fevers, facial pain, tooth pain, difficulty breathing or are worsening or not getting better in 5-7 days  For shoulder use heat 15 minutes twice daily, gentle stretching and tylenol as instructed as needed  For hand get cock up brace and where every night and during the day if you can  -follow up with your doctor in 1-2 months or sooner as needed

## 2013-07-29 NOTE — Progress Notes (Signed)
Chief Complaint  Patient presents with  . Cough    post nasal drip   . 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  Past Medical History  Diagnosis Date  . Sickle-cell trait 04/22/2007  . ANEMIA-NOS 04/22/2007  . Headache(784.0) 07/13/2007  . GERD 04/22/2007  . Menopause   . Sickle cell trait     Past Surgical History  Procedure Laterality Date  . Cholecystectomy    . Abdominal hysterectomy  1999  . Oophorectomy  2006    Family History  Problem Relation Age of Onset  . Cancer Mother     colon  . Cancer Sister     breast    History   Social History  . Marital Status: Divorced    Spouse Name: N/A    Number of Children: N/A  . Years of Education: N/A   Social History Main Topics  . Smoking status: Never Smoker   . Smokeless tobacco: None  . Alcohol Use: No  . Drug Use: No  . Sexual Activity: Yes   Other Topics Concern  . None   Social History Narrative  . None    Current outpatient prescriptions:VIRT-VITE 2.5-25-1 MG TABS tablet, Take one tablet by mouth twice daily, Disp: 60 tablet, Rfl: 4;  amoxicillin (AMOXIL) 875 MG tablet, Take 1 tablet (875 mg total) by mouth 2 (two) times daily., Disp: 20 tablet, Rfl: 0  EXAM:  Filed Vitals:   07/29/13 1106  BP: 110/80  Pulse: 97  Temp: 98 F (36.7 C)    Body mass index is 26.4 kg/(m^2).  GENERAL: vitals reviewed and listed above, alert, oriented, appears well  hydrated and in no acute distress  HEENT: atraumatic, conjunttiva clear, no obvious abnormalities on inspection of external nose and ears  NECK: no obvious masses on inspection, normal ROM, no bony TTP, spurlings normal  LUNGS: clear to auscultation bilaterally, no wheezes, rales or rhonchi, good air movement  CV: HRRR, no peripheral edema  MS: moves all extremities without noticeable abnormality -normal ROM, strength and sensation in upper ext bilat -muscle TTP R trap -post pahlens on R  PSYCH: pleasant and cooperative, no obvious depression or anxiety  ASSESSMENT AND PLAN:  Discussed the following assessment and plan:  CTS (carpal tunnel syndrome), right  Pain in joint, shoulder region, right  Sinusitis - Plan: amoxicillin (AMOXIL) 875 MG tablet  -we discussed possible serious and likely etiologies, workup and treatment, treatment risks and return precautions -after this discussion, Octavio GravesBonita opted for plan per orders and insturctions -follow up advised with PCP in 1-2 months  -of course, we advised Octavio GravesBonita  to return or notify a doctor immediately if symptoms worsen or persist or new concerns arise.  .  -Patient advised to return or notify a doctor immediately if symptoms worsen or persist or new concerns arise.  Patient Instructions  INSTRUCTIONS FOR UPPER RESPIRATORY INFECTION:  -plenty of rest and fluids  -nasal saline  wash 2-3 times daily (use prepackaged nasal saline or bottled/distilled water if making your own)   -As we discussed, we have prescribed a new medication for you at this appointment. We discussed the common and serious potential adverse effects of this medication and you can review these and more with the pharmacist when you pick up your medication.  Please follow the instructions for use carefully and notify us immediately if you have any problems taking this medication.  -can use sinex nasal spray for drainage and nasal congestion - but do NOT use  longer then 3-4 days  -can use tylenol or ibuprofen as directed for aches and sorethroat  -in the winter time, using a humidifier at night is helpful (please follow cleaning instructions)  -if you are taking a cough medication - use only as directed, may also try a teaspoon of honey to coat the throat and throat lozenges  -for sore throat, salt water gargles can help  -follow up if you have fevers, facial pain, tooth pain, difficulty breathing or are worsening or not getting better in 5-7 days  For shoulder use heat 15 minutes twice daily, gentle stretching and tylenol as instructed as needed  For hand get cock up brace and where every night and during the day if you can  -follow up with your doctor in 1-2 months or sooner as needed      Kileigh Ortmann, Dahlia Client R.

## 2013-08-15 ENCOUNTER — Encounter: Payer: Self-pay | Admitting: Family

## 2013-08-26 ENCOUNTER — Encounter: Payer: Self-pay | Admitting: Internal Medicine

## 2013-08-26 ENCOUNTER — Ambulatory Visit (INDEPENDENT_AMBULATORY_CARE_PROVIDER_SITE_OTHER)
Admission: RE | Admit: 2013-08-26 | Discharge: 2013-08-26 | Disposition: A | Discharge status: Home / Self Care | Payer: 59 | Source: Ambulatory Visit | Attending: Internal Medicine | Admitting: Internal Medicine

## 2013-08-26 ENCOUNTER — Ambulatory Visit (INDEPENDENT_AMBULATORY_CARE_PROVIDER_SITE_OTHER): Payer: 59 | Admitting: Internal Medicine

## 2013-08-26 VITALS — BP 120/74 | HR 80 | Temp 98.1°F | Resp 16 | Ht 63.0 in | Wt 149.0 lb

## 2013-08-26 DIAGNOSIS — M79609 Pain in unspecified limb: Secondary | ICD-10-CM

## 2013-08-26 DIAGNOSIS — D649 Anemia, unspecified: Secondary | ICD-10-CM

## 2013-08-26 DIAGNOSIS — K089 Disorder of teeth and supporting structures, unspecified: Secondary | ICD-10-CM

## 2013-08-26 DIAGNOSIS — M79642 Pain in left hand: Secondary | ICD-10-CM

## 2013-08-26 DIAGNOSIS — M25541 Pain in joints of right hand: Secondary | ICD-10-CM

## 2013-08-26 DIAGNOSIS — M25549 Pain in joints of unspecified hand: Secondary | ICD-10-CM

## 2013-08-26 DIAGNOSIS — K0889 Other specified disorders of teeth and supporting structures: Secondary | ICD-10-CM

## 2013-08-26 LAB — CBC WITH DIFFERENTIAL/PLATELET
Basophils Absolute: 0 10*3/uL (ref 0.0–0.1)
Basophils Relative: 0.5 % (ref 0.0–3.0)
Eosinophils Absolute: 0.1 10*3/uL (ref 0.0–0.7)
Eosinophils Relative: 1 % (ref 0.0–5.0)
HCT: 33.3 % — ABNORMAL LOW (ref 36.0–46.0)
Hemoglobin: 10.9 g/dL — ABNORMAL LOW (ref 12.0–15.0)
Lymphocytes Relative: 36.6 % (ref 12.0–46.0)
Lymphs Abs: 2.3 10*3/uL (ref 0.7–4.0)
MCHC: 32.8 g/dL (ref 30.0–36.0)
MCV: 79.7 fl (ref 78.0–100.0)
Monocytes Absolute: 0.5 10*3/uL (ref 0.1–1.0)
Monocytes Relative: 8.6 % (ref 3.0–12.0)
Neutro Abs: 3.4 10*3/uL (ref 1.4–7.7)
Neutrophils Relative %: 53.3 % (ref 43.0–77.0)
Platelets: 172 10*3/uL (ref 150.0–400.0)
RBC: 4.17 Mil/uL (ref 3.87–5.11)
RDW: 14.7 % — ABNORMAL HIGH (ref 11.5–14.6)
WBC: 6.4 10*3/uL (ref 4.5–10.5)

## 2013-08-26 NOTE — Progress Notes (Signed)
   Subjective:    Patient ID: Deanna Watkins, female    DOB: 07-09-1961, 53 y.o.   MRN: [id number]  Anemia  Headache   Hand Pain   thenar pain left Was seen for possible carpal tunnel after a fall ( trauma) Hand sling has helped No xrays No numbness just persistant pain and "soreness: Completed Health  Care gaps  Review of Systems  Constitutional: Negative.   Eyes: Negative.   Respiratory: Negative.   Gastrointestinal: Negative.   Musculoskeletal:       Tenderness in the right thenar eminence and at the base of the thumb  Neurological: Positive for headaches.       Pain at the upper gum line at the root of the incisor       Objective:   Physical Exam  Nursing note and vitals reviewed. Constitutional: She appears well-nourished.  HENT:  Head: Normocephalic and atraumatic.  Tenderness at the incisor root on the left  Eyes: Conjunctivae are normal. Pupils are equal, round, and reactive to light.  Neck: Normal range of motion. Neck supple.  Cardiovascular: Normal rate and regular rhythm.   Musculoskeletal:  Tenderness at the base of the right thenar area  Neurological: She is alert.          Assessment & Plan:  And x-rays to make sure that there is non-avulsion or a incomplete fracture of the thumb during her fall.  Consider referring to a hand specialist if the pain persists.  Less likely to be carpal tunnel since there is no digital numbness  History of anemia and appropriate monitoring  Tenderness at the incisor root Motrin and ibuprofen do to the movement of her teeth from basis if it persists consider referring back to dentistry

## 2013-08-26 NOTE — Progress Notes (Signed)
Pre visit review using our clinic review tool, if applicable. No additional management support is needed unless otherwise documented below in the visit note. 

## 2013-09-22 ENCOUNTER — Telehealth: Payer: Self-pay | Admitting: Internal Medicine

## 2013-09-22 NOTE — Telephone Encounter (Signed)
Pt calling wanting to inform dr. Lovell SheehanJenkins that her x rays on her hand did not show anything, however he wanted her to let him know if she was still having problems. Pt states she is still getting poor circulation and she is using a brace but still has tingling up to her elbow.

## 2013-11-25 ENCOUNTER — Ambulatory Visit: Payer: 59 | Admitting: Internal Medicine

## 2014-03-09 ENCOUNTER — Encounter: Payer: Self-pay | Admitting: Gastroenterology

## 2014-03-10 ENCOUNTER — Other Ambulatory Visit: Payer: 59

## 2014-03-17 ENCOUNTER — Other Ambulatory Visit (INDEPENDENT_AMBULATORY_CARE_PROVIDER_SITE_OTHER): Payer: 59

## 2014-03-17 ENCOUNTER — Encounter: Payer: 59 | Admitting: Internal Medicine

## 2014-03-17 DIAGNOSIS — Z0000 Encounter for general adult medical examination without abnormal findings: Secondary | ICD-10-CM

## 2014-03-17 LAB — CBC WITH DIFFERENTIAL/PLATELET
Basophils Absolute: 0 10*3/uL (ref 0.0–0.1)
Basophils Relative: 0.6 % (ref 0.0–3.0)
Eosinophils Absolute: 0.1 10*3/uL (ref 0.0–0.7)
Eosinophils Relative: 1 % (ref 0.0–5.0)
HCT: 31.9 % — ABNORMAL LOW (ref 36.0–46.0)
Hemoglobin: 10.5 g/dL — ABNORMAL LOW (ref 12.0–15.0)
Lymphocytes Relative: 37.4 % (ref 12.0–46.0)
Lymphs Abs: 1.9 10*3/uL (ref 0.7–4.0)
MCHC: 32.9 g/dL (ref 30.0–36.0)
MCV: 79.2 fl (ref 78.0–100.0)
Monocytes Absolute: 0.5 10*3/uL (ref 0.1–1.0)
Monocytes Relative: 9.3 % (ref 3.0–12.0)
Neutro Abs: 2.6 10*3/uL (ref 1.4–7.7)
Neutrophils Relative %: 51.7 % (ref 43.0–77.0)
Platelets: 163 10*3/uL (ref 150.0–400.0)
RBC: 4.03 Mil/uL (ref 3.87–5.11)
RDW: 14.4 % (ref 11.5–15.5)
WBC: 5.1 10*3/uL (ref 4.0–10.5)

## 2014-03-17 LAB — HEPATIC FUNCTION PANEL
ALT: 15 U/L (ref 0–35)
AST: 21 U/L (ref 0–37)
Albumin: 4 g/dL (ref 3.5–5.2)
Alkaline Phosphatase: 65 U/L (ref 39–117)
Bilirubin, Direct: 0.2 mg/dL (ref 0.0–0.3)
Total Bilirubin: 1.3 mg/dL — ABNORMAL HIGH (ref 0.2–1.2)
Total Protein: 7.4 g/dL (ref 6.0–8.3)

## 2014-03-17 LAB — LIPID PANEL
Cholesterol: 146 mg/dL (ref 0–200)
HDL: 44 mg/dL (ref >39.00)
LDL Cholesterol: 86 mg/dL (ref 0–99)
NonHDL: 102
Total CHOL/HDL Ratio: 3
Triglycerides: 80 mg/dL (ref 0.0–149.0)
VLDL: 16 mg/dL (ref 0.0–40.0)

## 2014-03-17 LAB — BASIC METABOLIC PANEL
BUN: 11 mg/dL (ref 6–23)
CO2: 28 mEq/L (ref 19–32)
Calcium: 9.3 mg/dL (ref 8.4–10.5)
Chloride: 108 mEq/L (ref 96–112)
Creatinine, Ser: 0.6 mg/dL (ref 0.4–1.2)
GFR: 125 mL/min (ref >60.00)
Glucose, Bld: 72 mg/dL (ref 70–99)
Potassium: 3.9 mEq/L (ref 3.5–5.1)
Sodium: 144 mEq/L (ref 135–145)

## 2014-03-17 LAB — POCT URINALYSIS DIPSTICK
Bilirubin, UA: NEGATIVE
Blood, UA: NEGATIVE
Glucose, UA: NEGATIVE
Ketones, UA: NEGATIVE
Leukocytes, UA: NEGATIVE
Nitrite, UA: NEGATIVE
Protein, UA: NEGATIVE
Spec Grav, UA: 1.01
Urobilinogen, UA: 2
pH, UA: 5.5

## 2014-03-17 LAB — TSH: TSH: 1.55 u[IU]/mL (ref 0.35–4.50)

## 2014-04-04 ENCOUNTER — Encounter: Payer: Self-pay | Admitting: Family

## 2014-04-04 ENCOUNTER — Ambulatory Visit (INDEPENDENT_AMBULATORY_CARE_PROVIDER_SITE_OTHER): Payer: 59 | Admitting: Family

## 2014-04-04 VITALS — BP 96/64 | HR 64 | Ht 63.25 in | Wt 147.6 lb

## 2014-04-04 DIAGNOSIS — R0982 Postnasal drip: Secondary | ICD-10-CM

## 2014-04-04 DIAGNOSIS — D649 Anemia, unspecified: Secondary | ICD-10-CM

## 2014-04-04 DIAGNOSIS — Z0000 Encounter for general adult medical examination without abnormal findings: Secondary | ICD-10-CM

## 2014-04-04 DIAGNOSIS — Z23 Encounter for immunization: Secondary | ICD-10-CM

## 2014-04-04 MED ORDER — FOLIC ACID-VIT B6-VIT B12 2.5-25-1 MG PO TABS: 1.0000 | ORAL_TABLET | Freq: Every day | ORAL | Status: AC

## 2014-04-04 NOTE — Addendum Note (Signed)
Addended by: Beverely Low on: 04/04/2014 04:37 PM   Modules accepted: Orders

## 2014-04-04 NOTE — Patient Instructions (Signed)
1. Probiotic like Align, once daily to help with gas.  2. Zyrtec-d to help with upper resp symptoms  Breast Self-Awareness Practicing breast self-awareness may pick up problems early, prevent significant medical complications, and possibly save your life. By practicing breast self-awareness, you can become familiar with how your breasts look and feel and if your breasts are changing. This allows you to notice changes early. It can also offer you some reassurance that your breast health is good. One way to learn what is normal for your breasts and whether your breasts are changing is to do a breast self-exam. If you find a lump or something that was not present in the past, it is best to contact your caregiver right away. Other findings that should be evaluated by your caregiver include nipple discharge, especially if it is bloody; skin changes or reddening; areas where the skin seems to be pulled in (retracted); or new lumps and bumps. Breast pain is seldom associated with cancer (malignancy), but should also be evaluated by a caregiver. HOW TO PERFORM A BREAST SELF-EXAM The best time to examine your breasts is 5-7 days after your menstrual period is over. During menstruation, the breasts are lumpier, and it may be more difficult to pick up changes. If you do not menstruate, have reached menopause, or had your uterus removed (hysterectomy), you should examine your breasts at regular intervals, such as monthly. If you are breastfeeding, examine your breasts after a feeding or after using a breast pump. Breast implants do not decrease the risk for lumps or tumors, so continue to perform breast self-exams as recommended. Talk to your caregiver about how to determine the difference between the implant and breast tissue. Also, talk about the amount of pressure you should use during the exam. Over time, you will become more familiar with the variations of your breasts and more comfortable with the exam. A breast  self-exam requires you to remove all your clothes above the waist. 1. Look at your breasts and nipples. Stand in front of a mirror in a room with good lighting. With your hands on your hips, push your hands firmly downward. Look for a difference in shape, contour, and size from one breast to the other (asymmetry). Asymmetry includes puckers, dips, or bumps. Also, look for skin changes, such as reddened or scaly areas on the breasts. Look for nipple changes, such as discharge, dimpling, repositioning, or redness. 2. Carefully feel your breasts. This is best done either in the shower or tub while using soapy water or when flat on your back. Place the arm (on the side of the breast you are examining) above your head. Use the pads (not the fingertips) of your three middle fingers on your opposite hand to feel your breasts. Start in the underarm area and use  inch (2 cm) overlapping circles to feel your breast. Use 3 different levels of pressure (light, medium, and firm pressure) at each circle before moving to the next circle. The light pressure is needed to feel the tissue closest to the skin. The medium pressure will help to feel breast tissue a little deeper, while the firm pressure is needed to feel the tissue close to the ribs. Continue the overlapping circles, moving downward over the breast until you feel your ribs below your breast. Then, move one finger-width towards the center of the body. Continue to use the  inch (2 cm) overlapping circles to feel your breast as you move slowly up toward the collar bone (clavicle)  near the base of the neck. Continue the up and down exam using all 3 pressures until you reach the middle of the chest. Do this with each breast, carefully feeling for lumps or changes. 3.  Keep a written record with breast changes or normal findings for each breast. By writing this information down, you do not need to depend only on memory for size, tenderness, or location. Write down where  you are in your menstrual cycle, if you are still menstruating. Breast tissue can have some lumps or thick tissue. However, see your caregiver if you find anything that concerns you.  SEEK MEDICAL CARE IF:  You see a change in shape, contour, or size of your breasts or nipples.   You see skin changes, such as reddened or scaly areas on the breasts or nipples.   You have an unusual discharge from your nipples.   You feel a new lump or unusually thick areas.  Document Released: 07/07/2005 Document Revised: 06/23/2012 Document Reviewed: 10/22/2011 [facility] Patient Information 2015 [place], [place]. This information is not intended to replace advice given to you by your health care provider. Make sure you discuss any questions you have with your health care provider.

## 2014-04-04 NOTE — Progress Notes (Signed)
Pre visit review using our clinic review tool, if applicable. No additional management support is needed unless otherwise documented below in the visit note. 

## 2014-04-04 NOTE — Progress Notes (Signed)
   Subjective:    Patient ID: Deanna Watkins, female    DOB: 11/11/1960, 53 y.o.   MRN: [id number]  HPI 53 year old [place] American female, nonsmoker, is in today for a CPX. Has concerns of sore throat, cough, congestion, x 3 days. Has not been taking any medication for relief.    Review of Systems  Constitutional: Negative.   HENT: Negative.   Eyes: Negative.   Respiratory: Negative.   Cardiovascular: Negative.   Gastrointestinal: Negative.   Endocrine: Negative.   Genitourinary: Negative.   Musculoskeletal: Negative.   Skin: Negative.   Allergic/Immunologic: Negative.   Neurological: Negative.   Hematological: Negative.   Psychiatric/Behavioral: Negative.    Past Medical History  Diagnosis Date  . Sickle-cell trait 04/22/2007  . ANEMIA-NOS 04/22/2007  . Headache(784.0) 07/13/2007  . GERD 04/22/2007  . Menopause   . Sickle cell trait     History   Social History  . Marital Status: Divorced    Spouse Name: N/A    Number of Children: N/A  . Years of Education: N/A   Occupational History  . Not on file.   Social History Main Topics  . Smoking status: Never Smoker   . Smokeless tobacco: Not on file  . Alcohol Use: No  . Drug Use: No  . Sexual Activity: Yes   Other Topics Concern  . Not on file   Social History Narrative  . No narrative on file    Past Surgical History  Procedure Laterality Date  . Cholecystectomy    . Abdominal hysterectomy  1999  . Oophorectomy  2006    Family History  Problem Relation Age of Onset  . Cancer Mother     colon  . Cancer Sister     breast    No Known Allergies  No current outpatient prescriptions on file prior to visit.   No current facility-administered medications on file prior to visit.    BP 96/64  Pulse 64  Ht 5' 3.25" (1.607 m)  Wt 147 lb 9.6 oz (66.951 kg)  BMI 25.93 kg/m2  SpO2 98%chart    Objective:   Physical Exam  Constitutional: She is oriented to person, place, and time. She appears  well-developed and well-nourished.  HENT:  Head: Normocephalic and atraumatic.  Right Ear: External ear normal.  Left Ear: External ear normal.  Nose: Nose normal.  Mouth/Throat: Oropharynx is clear and moist.  Eyes: Conjunctivae and EOM are normal. Pupils are equal, round, and reactive to light.  Neck: Normal range of motion. Neck supple. No thyromegaly present.  Cardiovascular: Normal rate, regular rhythm and normal heart sounds.   Pulmonary/Chest: Effort normal and breath sounds normal.  Abdominal: Soft. Bowel sounds are normal.  Musculoskeletal: Normal range of motion. She exhibits no edema and no tenderness.  Neurological: She is alert and oriented to person, place, and time. She has normal reflexes. She displays normal reflexes. No cranial nerve deficit. Coordination normal.  Skin: Skin is warm and dry.  Psychiatric: She has a normal mood and affect.          Assessment & Plan:  Deanna Watkins was seen today for annual exam.  Diagnoses and associated orders for this visit:  Preventative health care  Post-nasal drip  Anemia, unspecified anemia type  Other Orders - Folic Acid-Vit B6-Vit B12 (FOLBEE) 2.5-25-1 MG TABS tablet; Take 1 tablet by mouth daily.    Call the office with any questions or concerns. Recheck as scheduled and as needed.

## 2014-10-27 ENCOUNTER — Ambulatory Visit (INDEPENDENT_AMBULATORY_CARE_PROVIDER_SITE_OTHER): Payer: 59 | Admitting: Family Medicine

## 2014-10-27 ENCOUNTER — Encounter: Payer: Self-pay | Admitting: Family Medicine

## 2014-10-27 VITALS — BP 104/80 | HR 88 | Temp 98.5°F | Ht 63.25 in | Wt 151.5 lb

## 2014-10-27 DIAGNOSIS — J309 Allergic rhinitis, unspecified: Secondary | ICD-10-CM | POA: Diagnosis not present

## 2014-10-27 DIAGNOSIS — M546 Pain in thoracic spine: Secondary | ICD-10-CM | POA: Diagnosis not present

## 2014-10-27 DIAGNOSIS — M549 Dorsalgia, unspecified: Secondary | ICD-10-CM

## 2014-10-27 MED ORDER — FLUTICASONE PROPIONATE 50 MCG/ACT NA SUSP: 2.0000 | Freq: Every day | NASAL | Status: DC

## 2014-10-27 MED ORDER — CYCLOBENZAPRINE HCL 5 MG PO TABS: 5.0000 mg | ORAL_TABLET | Freq: Three times a day (TID) | ORAL | Status: DC | PRN

## 2014-10-27 NOTE — Progress Notes (Signed)
Pre visit review using our clinic review tool, if applicable. No additional management support is needed unless otherwise documented below in the visit note. 

## 2014-10-27 NOTE — Progress Notes (Signed)
HPI:   URI: -started: 4-5 days ago -symptoms:nasal congestion, sore throat, cough, PND -denies:fever, SOB, NVD, tooth pain -has tried: allergy treatments OTC -sick contacts/travel/risks: denies flu exposure, tick exposure or or Ebola risks -Hx of: allergies - taking claritin  R upper back pain: -for at least a month or 2 on and off -pain in moderate in upper R trap muscle -aleve helps -denies: fevers, weakness, numbness - though radiates and feels like this arm falls alsep if she sleeps on the R arm  ROS: See pertinent positives and negatives per HPI.  Past Medical History  Diagnosis Date  . Sickle-cell trait 04/22/2007  . ANEMIA-NOS 04/22/2007  . Headache(784.0) 07/13/2007  . GERD 04/22/2007  . Menopause   . Sickle cell trait     Past Surgical History  Procedure Laterality Date  . Cholecystectomy    . Abdominal hysterectomy  1999  . Oophorectomy  2006    Family History  Problem Relation Age of Onset  . Cancer Mother     colon  . Cancer Sister     breast    History   Social History  . Marital Status: Divorced    Spouse Name: N/A  . Number of Children: N/A  . Years of Education: N/A   Social History Main Topics  . Smoking status: Never Smoker   . Smokeless tobacco: Not on file  . Alcohol Use: No  . Drug Use: No  . Sexual Activity: Yes   Other Topics Concern  . None   Social History Narrative     Current outpatient prescriptions:  .  Folic Acid-Vit B6-Vit B12 (FOLBEE) 2.5-25-1 MG TABS tablet, Take 1 tablet by mouth daily., Disp: 30 tablet, Rfl: 4 .  cyclobenzaprine (FLEXERIL) 5 MG tablet, Take 1 tablet (5 mg total) by mouth 3 (three) times daily as needed for muscle spasms., Disp: 30 tablet, Rfl: 0 .  fluticasone (FLONASE) 50 MCG/ACT nasal spray, Place 2 sprays into both nostrils daily., Disp: 16 g, Rfl: 6  EXAM:  Filed Vitals:   10/27/14 1602  BP: 104/80  Pulse: 88  Temp: 98.5 F (36.9 C)    Body mass index is 26.61 kg/(m^2).  GENERAL:  vitals reviewed and listed above, alert, oriented, appears well hydrated and in no acute distress  HEENT: atraumatic, conjunttiva clear, no obvious abnormalities on inspection of external nose and ears, normal appearance of ear canals and TMs, boggy turbinates and clear nasal congestion, mild post oropharyngeal erythema with PND, no tonsillar edema or exudate, no sinus TTP  NECK: no obvious masses on inspection  LUNGS: clear to auscultation bilaterally, no wheezes, rales or rhonchi, good air movement  CV: HRRR, no peripheral edema  MS: moves all extremities without noticeable abnormality Normal ROM of head/neck/UE bilat, neg spurling Carries R shoulder higher then L TTP and spasm R trapezius Normal strength, DTRs and sensation in bilateral upper extremities bilaterally   PSYCH: pleasant and cooperative, no obvious depression or anxiety  ASSESSMENT AND PLAN:  Discussed the following assessment and plan:  Allergic rhinitis, unspecified allergic rhinitis type - Plan: fluticasone (FLONASE) 50 MCG/ACT nasal spray  Upper back pain - Plan: cyclobenzaprine (FLEXERIL) 5 MG tablet  -given HPI and exam findings today, a serious infection or illness is unlikely. We discussed potential etiologies, with allergies with possible VURI being most likely, and advised supportive care and monitoring. We discussed treatment side effects, likely course, antibiotic misuse, transmission, and signs of developing a serious illness. -start INS -for th back pain,  suspect muscular strain vs mild OA - advise HEP, supportive care and follow up in 1 month -of course, we advised to return or notify a doctor immediately if symptoms worsen or persist or new concerns arise.    Patient Instructions  BEFORE YOU LEAVE: -upper back exercises -schedule follow up in 1 month  Flonase 2 sprays each nostril every day  Continue Claritin daily  Please do the exercises at least 4 days per week  Can use aleve or tylenol  per instruction for pain if needed  Flexeril 5 mg nightly for 5 nights to help relax muscle         KIM, HANNAH R.

## 2014-10-27 NOTE — Patient Instructions (Signed)
BEFORE YOU LEAVE: -upper back exercises -schedule follow up in 1 month  Flonase 2 sprays each nostril every day  Continue Claritin daily  Please do the exercises at least 4 days per week  Can use aleve or tylenol per instruction for pain if needed  Flexeril 5 mg nightly for 5 nights to help relax muscle

## 2014-11-24 ENCOUNTER — Ambulatory Visit (INDEPENDENT_AMBULATORY_CARE_PROVIDER_SITE_OTHER)
Admission: RE | Admit: 2014-11-24 | Discharge: 2014-11-24 | Disposition: A | Discharge status: Home / Self Care | Payer: 59 | Source: Ambulatory Visit | Attending: Family Medicine | Admitting: Family Medicine

## 2014-11-24 ENCOUNTER — Encounter: Payer: Self-pay | Admitting: Family Medicine

## 2014-11-24 ENCOUNTER — Ambulatory Visit (INDEPENDENT_AMBULATORY_CARE_PROVIDER_SITE_OTHER): Payer: 59 | Admitting: Family Medicine

## 2014-11-24 VITALS — BP 112/70 | HR 85 | Temp 97.7°F | Ht 63.25 in | Wt 154.0 lb

## 2014-11-24 DIAGNOSIS — M545 Low back pain: Secondary | ICD-10-CM | POA: Diagnosis not present

## 2014-11-24 DIAGNOSIS — K219 Gastro-esophageal reflux disease without esophagitis: Secondary | ICD-10-CM

## 2014-11-24 NOTE — Patient Instructions (Signed)
BEFORE YOU LEAVE: -low back exercises -follow up in 4 weeks   FOR the back issues: -do the exercises at least 4 days per week -tylenol 500-1000mg  up to 3 times daily if needed for pain  For the nausea and belching: -nexium over the counter once daily for 2 weeks  Food Choices for Gastroesophageal Reflux Disease When you have gastroesophageal reflux disease (GERD), the foods you eat and your eating habits are very important. Choosing the right foods can help ease the discomfort of GERD. WHAT GENERAL GUIDELINES DO I NEED TO FOLLOW?  Choose fruits, vegetables, whole grains, low-fat dairy products, and low-fat meat, fish, and poultry.  Limit fats such as oils, salad dressings, butter, nuts, and avocado.  Keep a food diary to identify foods that cause symptoms.  Avoid foods that cause reflux. These may be different for different people.  Eat frequent small meals instead of three large meals each day.  Eat your meals slowly, in a relaxed setting.  Limit fried foods.  Cook foods using methods other than frying.  Avoid drinking alcohol.  Avoid drinking large amounts of liquids with your meals.  Avoid bending over or lying down until 2-3 hours after eating. WHAT FOODS ARE NOT RECOMMENDED? The following are some foods and drinks that may worsen your symptoms: Vegetables Tomatoes. Tomato juice. Tomato and spaghetti sauce. Chili peppers. Onion and garlic. Horseradish. Fruits Oranges, grapefruit, and lemon (fruit and juice). Meats High-fat meats, fish, and poultry. This includes hot dogs, ribs, ham, sausage, salami, and bacon. Dairy Whole milk and chocolate milk. Sour cream. Cream. Butter. Ice cream. Cream cheese.  Beverages Coffee and tea, with or without caffeine. Carbonated beverages or energy drinks. Condiments Hot sauce. Barbecue sauce.  Sweets/Desserts Chocolate and cocoa. Donuts. Peppermint and spearmint. Fats and Oils High-fat foods, including [place] fries and potato  chips. Other Vinegar. Strong spices, such as black pepper, white pepper, red pepper, cayenne, curry powder, cloves, ginger, and chili powder. The items listed above may not be a complete list of foods and beverages to avoid. Contact your dietitian for more information. Document Released: 07/07/2005 Document Revised: 07/12/2013 Document Reviewed: 05/11/2013 [facility] Patient Information 2015 [place], [place]. This information is not intended to replace advice given to you by your health care provider. Make sure you discuss any questions you have with your health care provider.

## 2014-11-24 NOTE — Progress Notes (Signed)
Pre visit review using our clinic review tool, if applicable. No additional management support is needed unless otherwise documented below in the visit note. 

## 2014-11-24 NOTE — Progress Notes (Signed)
HPI:  R low back pain: -started 2 weeks ago - after walking in the mall -has had problems with the hip before -pain is mild dull, constant in low back and buttock, occ tingling sensation in R foot  GERD: -had some belching last week -mild nausea a few time -denies: vomiting, weight, loss, diarrhea, abd pain, dysphagia  ROS: See pertinent positives and negatives per HPI.  Past Medical History  Diagnosis Date  . Sickle-cell trait 04/22/2007  . ANEMIA-NOS 04/22/2007  . Headache(784.0) 07/13/2007  . GERD 04/22/2007  . Menopause   . Sickle cell trait     Past Surgical History  Procedure Laterality Date  . Cholecystectomy    . Abdominal hysterectomy  1999  . Oophorectomy  2006    Family History  Problem Relation Age of Onset  . Cancer Mother     colon  . Cancer Sister     breast    History   Social History  . Marital Status: Divorced    Spouse Name: N/A  . Number of Children: N/A  . Years of Education: N/A   Social History Main Topics  . Smoking status: Never Smoker   . Smokeless tobacco: Not on file  . Alcohol Use: No  . Drug Use: No  . Sexual Activity: Yes   Other Topics Concern  . None   Social History Narrative     Current outpatient prescriptions:  .  cyclobenzaprine (FLEXERIL) 5 MG tablet, Take 1 tablet (5 mg total) by mouth 3 (three) times daily as needed for muscle spasms., Disp: 30 tablet, Rfl: 0 .  fluticasone (FLONASE) 50 MCG/ACT nasal spray, Place 2 sprays into both nostrils daily., Disp: 16 g, Rfl: 6 .  Folic Acid-Vit B6-Vit B12 (FOLBEE) 2.5-25-1 MG TABS tablet, Take 1 tablet by mouth daily., Disp: 30 tablet, Rfl: 4  EXAM:  Filed Vitals:   11/24/14 1248  BP: 112/70  Pulse: 85  Temp: 97.7 F (36.5 C)    Body mass index is 27.05 kg/(m^2).  GENERAL: vitals reviewed and listed above, alert, oriented, appears well hydrated and in no acute distress  HEENT: atraumatic, conjunttiva clear, no obvious abnormalities on inspection of external  nose and ears  NECK: no obvious masses on inspection  LUNGS: clear to auscultation bilaterally, no wheezes, rales or rhonchi, good air movement  CV: HRRR, no peripheral edema  ABD: BS+, soft, NTTP  MS: moves all extremities without noticeable abnormality Normal Gait Normal inspection of back, no obvious scoliosis or leg length descrepancy No bony TTP Soft tissue TTP at: R lumbar paraspinal muscles -/+ tests: neg trendelenburg,-facet loading, -SLRT, -CLRT, -FABER, -FADIR Normal muscle strength, sensation to light touch and DTRs in LEs bilaterally  PSYCH: pleasant and cooperative, no obvious depression or anxiety  ASSESSMENT AND PLAN:  Discussed the following assessment and plan:  Low back pain without sciatica, unspecified back pain laterality  -we discussed possible serious and likely etiologies, workup and treatment, treatment risks and return precautions - suspect muscular or possible mild radiculopathy - Plan: DG Lumbar Spine Complete -HEP, supportive care -f/u in 4 weeks -of course, we advised Tannis  to return or notify a doctor immediately if symptoms worsen or persist or new concerns arise.  Gastroesophageal reflux disease, esophagitis presence not specified -low dose PPI for 2 weeks  -Patient advised to return or notify a doctor immediately if symptoms worsen or persist or new concerns arise.  Patient Instructions  BEFORE YOU LEAVE: -low back exercises -follow up in 4 weeks  FOR the back issues: -do the exercises at least 4 days per week -tylenol 500-1000mg  up to 3 times daily if needed for pain  For the nausea and belching: -nexium over the counter once daily for 2 weeks  Food Choices for Gastroesophageal Reflux Disease When you have gastroesophageal reflux disease (GERD), the foods you eat and your eating habits are very important. Choosing the right foods can help ease the discomfort of GERD. WHAT GENERAL GUIDELINES DO I NEED TO FOLLOW?  Choose fruits,  vegetables, whole grains, low-fat dairy products, and low-fat meat, fish, and poultry.  Limit fats such as oils, salad dressings, butter, nuts, and avocado.  Keep a food diary to identify foods that cause symptoms.  Avoid foods that cause reflux. These may be different for different people.  Eat frequent small meals instead of three large meals each day.  Eat your meals slowly, in a relaxed setting.  Limit fried foods.  Cook foods using methods other than frying.  Avoid drinking alcohol.  Avoid drinking large amounts of liquids with your meals.  Avoid bending over or lying down until 2-3 hours after eating. WHAT FOODS ARE NOT RECOMMENDED? The following are some foods and drinks that may worsen your symptoms: Vegetables Tomatoes. Tomato juice. Tomato and spaghetti sauce. Chili peppers. Onion and garlic. Horseradish. Fruits Oranges, grapefruit, and lemon (fruit and juice). Meats High-fat meats, fish, and poultry. This includes hot dogs, ribs, ham, sausage, salami, and bacon. Dairy Whole milk and chocolate milk. Sour cream. Cream. Butter. Ice cream. Cream cheese.  Beverages Coffee and tea, with or without caffeine. Carbonated beverages or energy drinks. Condiments Hot sauce. Barbecue sauce.  Sweets/Desserts Chocolate and cocoa. Donuts. Peppermint and spearmint. Fats and Oils High-fat foods, including [place] fries and potato chips. Other Vinegar. Strong spices, such as black pepper, white pepper, red pepper, cayenne, curry powder, cloves, ginger, and chili powder. The items listed above may not be a complete list of foods and beverages to avoid. Contact your dietitian for more information. Document Released: 07/07/2005 Document Revised: 07/12/2013 Document Reviewed: 05/11/2013 [facility] Patient Information 2015 [place], [place]. This information is not intended to replace advice given to you by your health care provider. Make sure you discuss any questions you have with your  health care provider.      Kriste BasqueKIM, Denver Harder R.

## 2014-11-27 ENCOUNTER — Ambulatory Visit: Payer: 59 | Admitting: Family Medicine

## 2014-12-22 ENCOUNTER — Ambulatory Visit (INDEPENDENT_AMBULATORY_CARE_PROVIDER_SITE_OTHER)
Admission: RE | Admit: 2014-12-22 | Discharge: 2014-12-22 | Disposition: A | Discharge status: Home / Self Care | Payer: 59 | Source: Ambulatory Visit | Attending: Family Medicine | Admitting: Family Medicine

## 2014-12-22 ENCOUNTER — Encounter: Payer: Self-pay | Admitting: Family Medicine

## 2014-12-22 ENCOUNTER — Ambulatory Visit (INDEPENDENT_AMBULATORY_CARE_PROVIDER_SITE_OTHER): Payer: 59 | Admitting: Family Medicine

## 2014-12-22 VITALS — BP 112/82 | HR 93 | Temp 97.8°F | Ht 63.25 in | Wt 152.6 lb

## 2014-12-22 DIAGNOSIS — M545 Low back pain: Secondary | ICD-10-CM | POA: Diagnosis not present

## 2014-12-22 DIAGNOSIS — F39 Unspecified mood [affective] disorder: Secondary | ICD-10-CM | POA: Diagnosis not present

## 2014-12-22 DIAGNOSIS — J209 Acute bronchitis, unspecified: Secondary | ICD-10-CM

## 2014-12-22 MED ORDER — PREDNISONE 20 MG PO TABS: 20.0000 mg | ORAL_TABLET | Freq: Every day | ORAL | Status: DC

## 2014-12-22 NOTE — Progress Notes (Signed)
Pre visit review using our clinic review tool, if applicable. No additional management support is needed unless otherwise documented below in the visit note. 

## 2014-12-22 NOTE — Patient Instructions (Signed)
BEFORE YOU LEAVE: -xray sheet  Get the chest xray - if it shows any pnuemonia we will need to do an antibiotic  Please take the prednisone once daily  Please consider seeing Dr. Jason FilaBray about the anxiety  Do the back exercises 4 days per week to prevent any further back issues  Follow up as needed if symptoms persist or new concerns

## 2014-12-22 NOTE — Progress Notes (Signed)
HPI:  Slight wheeze sensation: -started a few weeks ago with some nasal symptoms, cough, congestion -cough resolved, still feels mild SOB or wheeze intermittently -has had sig stress and anxiety recently - sister has ca, and other sister has a lot of issues, she is caught up in this -has on several occ felt like she had a panic attack from the anxiety -denies: depression, fevers, weight, coughing up blood, SI, DOE, Orthopnea, swelling -has been in the ED in the past for this with dx of anxiety and costochondritis, she also has gerd  R low back pain: -started 6  weeks ago - after walking in the mall -has had problems with the hip before - reports this is ok now and was not going to return for this -pain was mild dull, constant in low back and buttock, occ tingling sensation in R foot  GERD: -denies: vomiting, weight, loss, diarrhea, abd pain, dysphagia -reports symptoms resolved with 2 week course of PPI  ROS: See pertinent positives and negatives per HPI.  Past Medical History  Diagnosis Date  . Sickle-cell trait 04/22/2007  . ANEMIA-NOS 04/22/2007  . Headache(784.0) 07/13/2007  . GERD 04/22/2007  . Menopause   . Sickle cell trait     Past Surgical History  Procedure Laterality Date  . Cholecystectomy    . Abdominal hysterectomy  1999  . Oophorectomy  2006    Family History  Problem Relation Age of Onset  . Cancer Mother     colon  . Cancer Sister     breast    History   Social History  . Marital Status: Divorced    Spouse Name: N/A  . Number of Children: N/A  . Years of Education: N/A   Social History Main Topics  . Smoking status: Never Smoker   . Smokeless tobacco: Not on file  . Alcohol Use: No  . Drug Use: No  . Sexual Activity: Yes   Other Topics Concern  . None   Social History Narrative     Current outpatient prescriptions:  .  fluticasone (FLONASE) 50 MCG/ACT nasal spray, Place 2 sprays into both nostrils daily., Disp: 16 g, Rfl: 6 .   Folic Acid-Vit B6-Vit B12 (FOLBEE) 2.5-25-1 MG TABS tablet, Take 1 tablet by mouth daily., Disp: 30 tablet, Rfl: 4 .  predniSONE (DELTASONE) 20 MG tablet, Take 1 tablet (20 mg total) by mouth daily with breakfast., Disp: 4 tablet, Rfl: 0  EXAM:  Filed Vitals:   12/22/14 1259  BP: 112/82  Pulse: 93  Temp: 97.8 F (36.6 C)    Body mass index is 26.8 kg/(m^2).  GENERAL: vitals reviewed and listed above, alert, oriented, appears well hydrated and in no acute distress  HEENT: atraumatic, conjunttiva clear, no obvious abnormalities on inspection of external nose and ears, normal appearance of ear canals and TMs, clear nasal congestion, mild post oropharyngeal erythema with PND, no tonsillar edema or exudate, no sinus TTP  NECK: no obvious masses on inspection  LUNGS: clear to auscultation bilaterally, no wheezes, rales or rhonchi, good air movement  CV: HRRR, no peripheral edema  MS: moves all extremities without noticeable abnormality  PSYCH: pleasant and cooperative, no obvious depression or anxiety  ASSESSMENT AND PLAN:  Discussed the following assessment and plan:  Acute bronchitis, unspecified organism - Plan: DG Chest 2 View -suspect her mild wheeze or SOB is related to recent URI, likely viral and her anxiety -opted for CXR to exclude other, does not sound cardiac in nature -tx  with prednisone (risks discussed) and follow up if persist  Mood disorder -discussed options, she opted to consider CBT -discussed medications as well and she was advised to follow up as needed  Low back pain without sciatica, unspecified back pain laterality -reports this is fine now, advised HEP prophylactically and follow up as needed  -Patient advised to return or notify a doctor immediately if symptoms worsen or persist or new concerns arise.  Patient Instructions  BEFORE YOU LEAVE: -xray sheet  Get the chest xray - if it shows any pnuemonia we will need to do an antibiotic  Please  take the prednisone once daily  Please consider seeing Dr. Jason FilaBray about the anxiety  Do the back exercises 4 days per week to prevent any further back issues  Follow up as needed if symptoms persist or new concerns     Granger Chui R.

## 2015-01-11 ENCOUNTER — Ambulatory Visit: Payer: 59 | Admitting: Psychology

## 2015-01-29 ENCOUNTER — Encounter (HOSPITAL_COMMUNITY): Payer: Self-pay

## 2015-01-29 ENCOUNTER — Emergency Department (HOSPITAL_COMMUNITY)
Admission: EM | Admit: 2015-01-29 | Discharge: 2015-01-29 | Disposition: A | Discharge status: Home / Self Care | Payer: 59 | Attending: Emergency Medicine | Admitting: Emergency Medicine

## 2015-01-29 ENCOUNTER — Telehealth: Payer: Self-pay | Admitting: Family Medicine

## 2015-01-29 ENCOUNTER — Emergency Department (HOSPITAL_COMMUNITY): Payer: 59

## 2015-01-29 DIAGNOSIS — R0789 Other chest pain: Secondary | ICD-10-CM | POA: Insufficient documentation

## 2015-01-29 DIAGNOSIS — Z780 Asymptomatic menopausal state: Secondary | ICD-10-CM | POA: Diagnosis not present

## 2015-01-29 DIAGNOSIS — Z79899 Other long term (current) drug therapy: Secondary | ICD-10-CM | POA: Insufficient documentation

## 2015-01-29 DIAGNOSIS — R079 Chest pain, unspecified: Secondary | ICD-10-CM | POA: Diagnosis present

## 2015-01-29 DIAGNOSIS — Z862 Personal history of diseases of the blood and blood-forming organs and certain disorders involving the immune mechanism: Secondary | ICD-10-CM | POA: Diagnosis not present

## 2015-01-29 DIAGNOSIS — K219 Gastro-esophageal reflux disease without esophagitis: Secondary | ICD-10-CM | POA: Insufficient documentation

## 2015-01-29 LAB — CBC
HCT: 30.1 % — ABNORMAL LOW (ref 36.0–46.0)
Hemoglobin: 10.7 g/dL — ABNORMAL LOW (ref 12.0–15.0)
MCH: 25.4 pg — ABNORMAL LOW (ref 26.0–34.0)
MCHC: 35.5 g/dL (ref 30.0–36.0)
MCV: 71.5 fL — ABNORMAL LOW (ref 78.0–100.0)
Platelets: 143 10*3/uL — ABNORMAL LOW (ref 150–400)
RBC: 4.21 MIL/uL (ref 3.87–5.11)
RDW: 14.5 % (ref 11.5–15.5)
WBC: 5.8 10*3/uL (ref 4.0–10.5)

## 2015-01-29 LAB — BASIC METABOLIC PANEL
Anion gap: 9 (ref 5–15)
BUN: 14 mg/dL (ref 6–20)
CO2: 28 mmol/L (ref 22–32)
Calcium: 9.5 mg/dL (ref 8.9–10.3)
Chloride: 104 mmol/L (ref 101–111)
Creatinine, Ser: 0.72 mg/dL (ref 0.44–1.00)
GFR calc Af Amer: >60 mL/min (ref >60)
GFR calc non Af Amer: >60 mL/min (ref >60)
Glucose, Bld: 106 mg/dL — ABNORMAL HIGH (ref 65–99)
Potassium: 3.5 mmol/L (ref 3.5–5.1)
Sodium: 141 mmol/L (ref 135–145)

## 2015-01-29 LAB — I-STAT TROPONIN, ED
Troponin i, poc: 0 ng/mL (ref 0.00–0.08)
Troponin i, poc: 0 ng/mL (ref 0.00–0.08)

## 2015-01-29 NOTE — ED Notes (Signed)
Pt presents with c/o centralized chest pain that she experienced approx an hour and a half ago and has now resolved. Pt reports the pain was shooting down her right arm as well. Pt denies any shortness of breath or dizziness with the pain, NAD at this time.

## 2015-01-29 NOTE — Telephone Encounter (Signed)
Patient Name: Deanna MeekerBONITA Lavin DOB: [birth year] Initial Comment Caller states she has sharp pain in right arm, middle chest and upper back Nurse Assessment Nurse: Charna Elizabethrumbull, RN, Lynden Angathy Date/Time (Eastern Time): 01/29/2015 10:51:24 AM Confirm and document reason for call. If symptomatic, describe symptoms. ---Caller states she developed sharp pain in her chest, upper back and right arm about 30 minutes ago that has gone away. No breathing difficulty. No injury in the past 3 days. Has the patient traveled out of the country within the last 30 days? ---No Does the patient require triage? ---Yes Related visit to physician within the last 2 weeks? ---Yes Does the PT have any chronic conditions? (i.e. diabetes, asthma, etc.) ---No Did the patient indicate they were pregnant? ---No Guidelines Guideline Title Affirmed Question Affirmed Notes Chest Pain Pain also present in shoulder(s) or arm(s) or jaw (Exception: pain is clearly made worse by movement) arm Final Disposition User Go to ED Now Charna Elizabethrumbull, RN, Cathy [facility]

## 2015-01-29 NOTE — Discharge Instructions (Signed)
Return here as needed.  Follow-up with your primary care doctor.  Your testing here today did not show any abnormality that would explain your pain

## 2015-01-30 NOTE — Telephone Encounter (Signed)
I see a note in the pts chart that she was seen in the ED on 7/11.

## 2015-01-30 NOTE — ED Provider Notes (Signed)
CSN: [id number]     Arrival date & time 01/29/15  1109 History   First MD Initiated Contact with Patient 01/29/15 1259     Chief Complaint  Patient presents with  . Chest Pain     (Consider location/radiation/quality/duration/timing/severity/associated sxs/prior Treatment) HPI Patient presents to the emergency department with chest pain that started at 10 AM the patient states she had a sudden sharp arm pain radiated up to her shoulder and to her right side of her chest.  Patient states that it only lasted for about 30 seconds to a minute.  The patient states that she had no shortness of breath, nausea, vomiting, abdominal pain, back pain, neck pain, fever, cough, or syncope.  The patient states that nothing seemed to make the condition better or worse.  She did not take any medications prior to arrival Past Medical History  Diagnosis Date  . Sickle-cell trait 04/22/2007  . ANEMIA-NOS 04/22/2007  . Headache(784.0) 07/13/2007  . GERD 04/22/2007  . Menopause   . Sickle cell trait    Past Surgical History  Procedure Laterality Date  . Cholecystectomy    . Abdominal hysterectomy  1999  . Oophorectomy  2006   Family History  Problem Relation Age of Onset  . Cancer Mother     colon  . Cancer Sister     breast   History  Substance Use Topics  . Smoking status: Never Smoker   . Smokeless tobacco: Not on file  . Alcohol Use: No   OB History    No data available     Review of Systems All other systems negative except as documented in the HPI. All pertinent positives and negatives as reviewed in the HPI.   Allergies  Review of patient's allergies indicates no known allergies.  Home Medications   Prior to Admission medications   Medication Sig Start Date End Date Taking? Authorizing Provider  cetirizine (ZYRTEC) 10 MG tablet Take 10 mg by mouth daily as needed for allergies.   Yes Historical Provider, MD  ibuprofen (ADVIL,MOTRIN) 200 MG tablet Take 400-600 mg by mouth every 6  (six) hours as needed for headache or moderate pain.   Yes Historical Provider, MD  Multiple Vitamin (MULTIVITAMIN WITH MINERALS) TABS tablet Take 1 tablet by mouth daily.   Yes Historical Provider, MD  fluticasone (FLONASE) 50 MCG/ACT nasal spray Place 2 sprays into both nostrils daily. Patient not taking: Reported on 01/29/2015 10/27/14   Terressa KoyanagiHannah R Kim, DO  Folic Acid-Vit B6-Vit B12 (FOLBEE) 2.5-25-1 MG TABS tablet Take 1 tablet by mouth daily. Patient not taking: Reported on 01/29/2015 04/04/14   Eulis FosterPadonda B Webb, FNP  predniSONE (DELTASONE) 20 MG tablet Take 1 tablet (20 mg total) by mouth daily with breakfast. Patient not taking: Reported on 01/29/2015 12/22/14   Terressa KoyanagiHannah R Kim, DO   BP 109/80 mmHg  Pulse 86  Temp(Src) 98.1 F (36.7 C) (Oral)  Resp 14  SpO2 100% Physical Exam  Constitutional: She is oriented to person, place, and time. She appears well-developed and well-nourished. No distress.  HENT:  Head: Normocephalic and atraumatic.  Eyes: Pupils are equal, round, and reactive to light.  Neck: Normal range of motion. Neck supple.  Cardiovascular: Normal rate, regular rhythm and normal heart sounds.  Exam reveals no gallop and no friction rub.   No murmur heard. Pulmonary/Chest: Effort normal and breath sounds normal. No respiratory distress. She has no wheezes. She exhibits no tenderness.  Neurological: She is alert and oriented to person, place,  and time. She exhibits normal muscle tone. Coordination normal.  Skin: Skin is warm and dry. No rash noted. No erythema.  Psychiatric: She has a normal mood and affect. Her behavior is normal.  Nursing note and vitals reviewed.   ED Course  Procedures (including critical care time) Labs Review Labs Reviewed  BASIC METABOLIC PANEL - Abnormal; Notable for the following:    Glucose, Bld 106 (*)    All other components within normal limits  CBC - Abnormal; Notable for the following:    Hemoglobin 10.7 (*)    HCT 30.1 (*)    MCV 71.5 (*)     MCH 25.4 (*)    Platelets 143 (*)    All other components within normal limits  I-STAT TROPOININ, ED  Rosezena Sensor, ED    Imaging Review Dg Chest 2 View  01/29/2015   CLINICAL DATA:  Chest pain and right arm pain and upper back pain.  EXAM: CHEST  2 VIEW  COMPARISON:  12/22/2014  FINDINGS: The heart size and mediastinal contours are within normal limits. Both lungs are clear. The visualized skeletal structures are unremarkable.  IMPRESSION: Normal exam.   Electronically Signed   By: Francene Boyers M.D.   On: 01/29/2015 12:12     EKG Interpretation   Date/Time:  Monday January 29 2015 11:22:40 EDT Ventricular Rate:  94 PR Interval:  134 QRS Duration: 74 QT Interval:  369 QTC Calculation: 461 R Axis:   78 Text Interpretation:  Sinus rhythm Low voltage, precordial leads Baseline  wander in lead(s) II No significant change since last tracing Confirmed by  STEINL  MD, Caryn Bee ([id number]) on 01/29/2015 1:30:29 PM     This sharp arm pain that radiated up to the lower chest lasted for less than a minute and there was no other symptoms associated with this.  This seems an atypical presentation for cardiac chest pain.  We will have the patient follow up with her primary care doctor.  There is 2 sets of negative troponins and normal EKG patient is advised of the plan and all questions were answered    Charlestine Night, PA-C 01/30/15 0703  Cathren Laine, MD 01/30/15 [phone]

## 2015-01-31 ENCOUNTER — Ambulatory Visit (INDEPENDENT_AMBULATORY_CARE_PROVIDER_SITE_OTHER): Payer: 59 | Admitting: Psychology

## 2015-01-31 ENCOUNTER — Other Ambulatory Visit: Payer: Self-pay | Admitting: Nurse Practitioner

## 2015-01-31 DIAGNOSIS — F4322 Adjustment disorder with anxiety: Secondary | ICD-10-CM

## 2015-01-31 DIAGNOSIS — N6310 Unspecified lump in the right breast, unspecified quadrant: Secondary | ICD-10-CM

## 2015-02-05 ENCOUNTER — Ambulatory Visit: Payer: 59 | Admitting: Family Medicine

## 2015-02-05 ENCOUNTER — Other Ambulatory Visit: Payer: 59

## 2015-02-06 ENCOUNTER — Ambulatory Visit
Admission: RE | Admit: 2015-02-06 | Discharge: 2015-02-06 | Disposition: A | Discharge status: Home / Self Care | Payer: 59 | Source: Ambulatory Visit | Attending: Nurse Practitioner | Admitting: Nurse Practitioner

## 2015-02-06 DIAGNOSIS — N6310 Unspecified lump in the right breast, unspecified quadrant: Secondary | ICD-10-CM

## 2015-03-01 ENCOUNTER — Ambulatory Visit (INDEPENDENT_AMBULATORY_CARE_PROVIDER_SITE_OTHER): Payer: 59 | Admitting: Psychology

## 2015-03-01 DIAGNOSIS — F4322 Adjustment disorder with anxiety: Secondary | ICD-10-CM | POA: Diagnosis not present

## 2015-03-15 ENCOUNTER — Ambulatory Visit: Payer: 59 | Admitting: Psychology

## 2015-03-28 ENCOUNTER — Ambulatory Visit: Payer: 59 | Admitting: Psychology

## 2015-03-29 ENCOUNTER — Ambulatory Visit (INDEPENDENT_AMBULATORY_CARE_PROVIDER_SITE_OTHER): Payer: 59 | Admitting: Psychology

## 2015-03-29 DIAGNOSIS — F4322 Adjustment disorder with anxiety: Secondary | ICD-10-CM | POA: Diagnosis not present

## 2015-04-05 ENCOUNTER — Encounter: Payer: Self-pay | Admitting: Family Medicine

## 2015-04-05 ENCOUNTER — Ambulatory Visit (INDEPENDENT_AMBULATORY_CARE_PROVIDER_SITE_OTHER): Payer: 59 | Admitting: Family Medicine

## 2015-04-05 ENCOUNTER — Other Ambulatory Visit: Payer: 59

## 2015-04-05 VITALS — BP 102/80 | HR 94 | Temp 98.4°F | Ht 63.25 in | Wt 150.7 lb

## 2015-04-05 DIAGNOSIS — R202 Paresthesia of skin: Secondary | ICD-10-CM | POA: Diagnosis not present

## 2015-04-05 DIAGNOSIS — D598 Other acquired hemolytic anemias: Secondary | ICD-10-CM | POA: Diagnosis not present

## 2015-04-05 DIAGNOSIS — Z0000 Encounter for general adult medical examination without abnormal findings: Secondary | ICD-10-CM

## 2015-04-05 DIAGNOSIS — F330 Major depressive disorder, recurrent, mild: Secondary | ICD-10-CM

## 2015-04-05 DIAGNOSIS — D573 Sickle-cell trait: Secondary | ICD-10-CM | POA: Diagnosis not present

## 2015-04-05 DIAGNOSIS — J309 Allergic rhinitis, unspecified: Secondary | ICD-10-CM | POA: Diagnosis not present

## 2015-04-05 LAB — CBC WITH DIFFERENTIAL/PLATELET
Basophils Absolute: 0 10*3/uL (ref 0.0–0.1)
Basophils Relative: 0.6 % (ref 0.0–3.0)
Eosinophils Absolute: 0 10*3/uL (ref 0.0–0.7)
Eosinophils Relative: 0.8 % (ref 0.0–5.0)
HCT: 33 % — ABNORMAL LOW (ref 36.0–46.0)
Hemoglobin: 11.1 g/dL — ABNORMAL LOW (ref 12.0–15.0)
Lymphocytes Relative: 28 % (ref 12.0–46.0)
Lymphs Abs: 1.8 10*3/uL (ref 0.7–4.0)
MCHC: 33.5 g/dL (ref 30.0–36.0)
MCV: 77 fl — ABNORMAL LOW (ref 78.0–100.0)
Monocytes Absolute: 0.5 10*3/uL (ref 0.1–1.0)
Monocytes Relative: 7.9 % (ref 3.0–12.0)
Neutro Abs: 3.9 10*3/uL (ref 1.4–7.7)
Neutrophils Relative %: 62.7 % (ref 43.0–77.0)
Platelets: 172 10*3/uL (ref 150.0–400.0)
RBC: 4.29 Mil/uL (ref 3.87–5.11)
RDW: 14.9 % (ref 11.5–15.5)
WBC: 6.3 10*3/uL (ref 4.0–10.5)

## 2015-04-05 LAB — TSH: TSH: 1.67 u[IU]/mL (ref 0.35–4.50)

## 2015-04-05 NOTE — Patient Instructions (Addendum)
BEFORE YOU LEAVE: -labs -schedule follow up in 1-2 months -low back exercises  -We have ordered labs or studies at this visit. It can take up to 1-2 weeks for results and processing. We will contact you with instructions IF your results are abnormal. Normal results will be released to your [facility]. If you have not heard from [place] or can not find your results in [facility] in 2 weeks please contact our office.  -Vit D3 [phone] IU daily  -We recommend the following healthy lifestyle measures: - eat a healthy diet consisting of lots of vegetables, fruits, beans, nuts, seeds, healthy meats such as white chicken and fish and whole grains.  - avoid fried foods, fast food, processed foods, sodas, red meet and other fattening foods.  - get a least 150 minutes of aerobic exercise per week.   -do the exercises 4 days per week and call in 1 month if leg issues persist and we will send referral to neurology  -see your Ear Nose and Throat doctor regarding the tinnitus

## 2015-04-05 NOTE — Progress Notes (Addendum)
HPI:  Here for CPE and transfer of care:  -Concerns and/or follow up today:  Deanna Watkins is a pleasant 54 yo here to transfer care and for her CPE. She has a number of chronic and acute concerns:  Anemia: -her whole life, told due to sst and take folbee for this -wants to check labs -takes ibuprofen daily for sleep -denies dizziness, bleeding, melena, hematochezia  Tinnitus: -chronic, saw ENT in the past -wonders if there is something OTC to take for this  R leg paresthesia: -for a few weeks -down back of R leg and lower leg, intermittent, can occur at anytime - pain and tingling and perceived weakness -denies: falls, malaise, fevers, actual weakness, constant symptoms or sig pain  -Diet: not great  -Exercise: no regular exercise  -Taking folic acid, vitamin D or calcium: no  -Diabetes and Dyslipidemia Screening: done last year  -Hx of HTN: no  -Vaccines: refuses flu  -pap history: does with gyn, Dr. Huntley Watkins, s/p hysterectomy  -FDLMP: n/a  -sexual activity: yes, female partner, no new partners  -wants STI testing (Hep C if born [birth year]): no, agrees to hep c test once  Sees Dr. Huntley Watkins in gyn for female, breast and bone health  -Alcohol, Tobacco, drug use: see social history  Review of Systems - no fevers, unintentional weight loss, vision loss, hearing loss, chest pain, sob, hemoptysis, melena, hematochezia, hematuria, genital discharge, changing or concerning skin lesions, bleeding, bruising, loc, thoughts of self harm or SI  Past Medical History  Diagnosis Date  . Sickle-cell trait 04/22/2007  . ANEMIA-NOS 04/22/2007  . Headache(784.0) 07/13/2007  . GERD 04/22/2007  . Menopause   . Sickle cell trait     Past Surgical History  Procedure Laterality Date  . Cholecystectomy    . Abdominal hysterectomy  1999  . Oophorectomy  2006    Family History  Problem Relation Age of Onset  . Cancer Mother     colon  . Cancer Sister     breast    Social  History   Social History  . Marital Status: Divorced    Spouse Name: N/A  . Number of Children: N/A  . Years of Education: N/A   Social History Main Topics  . Smoking status: Never Smoker   . Smokeless tobacco: None  . Alcohol Use: No  . Drug Use: No  . Sexual Activity: Yes   Other Topics Concern  . None   Social History Narrative     Current outpatient prescriptions:  .  cetirizine (ZYRTEC) 10 MG tablet, Take 10 mg by mouth daily as needed for allergies., Disp: , Rfl:  .  fluticasone (FLONASE) 50 MCG/ACT nasal spray, Place 2 sprays into both nostrils daily., Disp: 16 g, Rfl: 6 .  Folic Acid-Vit B6-Vit B12 (FOLBEE) 2.5-25-1 MG TABS tablet, Take 1 tablet by mouth daily., Disp: 30 tablet, Rfl: 4 .  ibuprofen (ADVIL,MOTRIN) 200 MG tablet, Take 400-600 mg by mouth every 6 (six) hours as needed for headache or moderate pain., Disp: , Rfl:  .  Multiple Vitamin (MULTIVITAMIN WITH MINERALS) TABS tablet, Take 1 tablet by mouth daily., Disp: , Rfl:   EXAM:  Filed Vitals:   04/05/15 0821  BP: 102/80  Pulse: 94  Temp: 98.4 F (36.9 C)    GENERAL: vitals reviewed and listed below, alert, oriented, appears well hydrated and in no acute distress  HEENT: head atraumatic, PERRLA, normal appearance of eyes, ears, nose and mouth. moist mucus membranes.  NECK: supple,  no masses or lymphadenopathy  LUNGS: clear to auscultation bilaterally, no rales, rhonchi or wheeze  CV: HRRR, no peripheral edema or cyanosis, normal pedal pulses  BREAST: does with gyn  ABDOMEN: bowel sounds normal, soft, non tender to palpation, no masses, no rebound or guarding  GU: deferred, does with gyn  SKIN: no rash or abnormal lesions  MS: normal gait, moves all extremities normally Normal gait, notmal inspection both legs, no TTP, no bony TTP back or pelvic or legs, normal strength, sensation to light touch and DTRs in LE bilat  NEURO: CN II-XII grossly intact, normal muscle strength and sensation to  light touch on extremities  PSYCH: normal affect, pleasant and cooperative  ASSESSMENT AND PLAN:  Discussed the following assessment and plan:  Visit for preventive health examination - Plan: Hemoglobin A1c, Hep C Antibody  Paresthesia of right leg - Plan: Methylmalonic Acid, Serum, Hemoglobin A1c, TSH -normal exam, query mild radicular pain, offered plain films -we discussed possible serious and likely etiologies, workup and treatment, treatment risks and return precautions -after this discussion, Deanna Watkins opted for labs and follow up - possible neurology eval if persists -follow up advised in 1 month -of course, we advised Deanna Watkins  to return or notify a doctor immediately if symptoms worsen or persist or new concerns arise.  Allergic rhinitis, unspecified allergic rhinitis type  Other acquired hemolytic anemias - Plan: CBC with Differential  Sickle-cell trait  Major depressive disorder, recurrent episode, mild   -Discussed and advised all [place] preventive services health task force level A and B recommendations for age, sex and risks.  -Advised at least 150 minutes of exercise per week and a healthy diet low in saturated fats and sweets and consisting of fresh fruits and vegetables, lean meats such as fish and white chicken and whole grains.  -labs, studies and vaccines per orders this encounter  Orders Placed This Encounter  Procedures  . CBC with Differential  . Methylmalonic Acid, Serum  . Hemoglobin A1c  . TSH  . Hep C Antibody    Patient advised to return to clinic immediately if symptoms worsen or persist or new concerns.  Patient Instructions  BEFORE YOU LEAVE: -labs -schedule follow up in 1-2 months  -We have ordered labs or studies at this visit. It can take up to 1-2 weeks for results and processing. We will contact you with instructions IF your results are abnormal. Normal results will be released to your [facility]. If you have not heard from [place] or can not find  your results in [facility] in 2 weeks please contact our office.  -Vit D3 [phone] IU daily  -We recommend the following healthy lifestyle measures: - eat a healthy diet consisting of lots of vegetables, fruits, beans, nuts, seeds, healthy meats such as white chicken and fish and whole grains.  - avoid fried foods, fast food, processed foods, sodas, red meet and other fattening foods.  - get a least 150 minutes of aerobic exercise per week.   -call in 1 month if leg issues persist   -see your Ear Nose and Throat doctor regarding the tinnitus         No Follow-up on file.  Kriste Basque R.

## 2015-04-05 NOTE — Progress Notes (Signed)
Pre visit review using our clinic review tool, if applicable. No additional management support is needed unless otherwise documented below in the visit note. 

## 2015-04-06 LAB — HEMOGLOBIN A1C
Hgb A1c MFr Bld: 4.7 % (ref <5.7)
Mean Plasma Glucose: 88 mg/dL (ref <117)

## 2015-04-06 LAB — HEPATITIS C ANTIBODY: HCV Ab: NEGATIVE

## 2015-04-09 LAB — METHYLMALONIC ACID, SERUM: Methylmalonic Acid, Quant: 140 nmol/L (ref 87–318)

## 2015-04-26 ENCOUNTER — Ambulatory Visit: Payer: 59 | Admitting: Psychology

## 2015-05-29 LAB — HM MAMMOGRAPHY

## 2015-05-29 LAB — HM PAP SMEAR

## 2015-06-06 ENCOUNTER — Ambulatory Visit: Payer: 59 | Admitting: Family Medicine

## 2015-08-17 ENCOUNTER — Encounter: Payer: Self-pay | Admitting: Family Medicine

## 2015-08-17 ENCOUNTER — Ambulatory Visit (INDEPENDENT_AMBULATORY_CARE_PROVIDER_SITE_OTHER): Payer: 59 | Admitting: Family Medicine

## 2015-08-17 VITALS — BP 101/71 | HR 89 | Temp 98.7°F | Ht 63.25 in | Wt 148.0 lb

## 2015-08-17 DIAGNOSIS — M542 Cervicalgia: Secondary | ICD-10-CM

## 2015-08-17 MED ORDER — CYCLOBENZAPRINE HCL 10 MG PO TABS: 10.0000 mg | ORAL_TABLET | Freq: Three times a day (TID) | ORAL | Status: DC | PRN

## 2015-08-17 NOTE — Progress Notes (Signed)
Pre visit review using our clinic review tool, if applicable. No additional management support is needed unless otherwise documented below in the visit note. 

## 2015-08-17 NOTE — Progress Notes (Signed)
   Subjective:    Patient ID: Deanna Watkins, female    DOB: [birth year], 55 y.o.   MRN: [id number]  HPI Here for 6 months of mild sharp pains that start in the right neck area and radiate down the right arm and into the right armpit area. Sometimes she has numbness or tingling in the right hand. No breast tenderness. She had a normal diagnostic mammogram and [place] on the right breast in July 2016 and then she had normal screening 3D mammograms in December at [facility]. She takes Ibuprofen for this pain and gets partial relief. It bothers her more at night than during the day. She works out at [employer] with [employer] and this actually helps her feel better. No SOB.    Review of Systems  Constitutional: Negative.   Eyes: Negative.   Respiratory: Negative.   Cardiovascular: Negative.   Musculoskeletal: Positive for neck pain and neck stiffness.  Neurological: Positive for numbness. Negative for weakness.       Objective:   Physical Exam  Constitutional: She appears well-developed and well-nourished.  Neck: Normal range of motion. Neck supple. No thyromegaly present.  Cardiovascular: Normal rate, regular rhythm, normal heart sounds and intact distal pulses.   Pulmonary/Chest: Effort normal and breath sounds normal.  Musculoskeletal:  Mildly tender in the right posterior neck and in the right upper trapezius. The right shoulder is normal   Lymphadenopathy:    She has no cervical adenopathy.          Assessment & Plan:  Neck pain with radicular symptoms in the right arm and axilla. Try taking 2 Aleve tablets bid. Use Flexeril at night. Add moist heat. Recheck prn

## 2016-01-23 ENCOUNTER — Encounter: Payer: Self-pay | Admitting: Family Medicine

## 2016-01-23 ENCOUNTER — Ambulatory Visit (INDEPENDENT_AMBULATORY_CARE_PROVIDER_SITE_OTHER): Payer: 59 | Admitting: Family Medicine

## 2016-01-23 VITALS — BP 128/82 | HR 78 | Temp 98.1°F | Resp 12 | Ht 63.25 in | Wt 147.0 lb

## 2016-01-23 DIAGNOSIS — R0789 Other chest pain: Secondary | ICD-10-CM | POA: Diagnosis not present

## 2016-01-23 DIAGNOSIS — M542 Cervicalgia: Secondary | ICD-10-CM

## 2016-01-23 DIAGNOSIS — R5383 Other fatigue: Secondary | ICD-10-CM

## 2016-01-23 DIAGNOSIS — D598 Other acquired hemolytic anemias: Secondary | ICD-10-CM | POA: Diagnosis not present

## 2016-01-23 LAB — CBC WITH DIFFERENTIAL/PLATELET
Basophils Absolute: 0 10*3/uL (ref 0.0–0.1)
Basophils Relative: 0.4 % (ref 0.0–3.0)
Eosinophils Absolute: 0 10*3/uL (ref 0.0–0.7)
Eosinophils Relative: 0.5 % (ref 0.0–5.0)
HCT: 33.5 % — ABNORMAL LOW (ref 36.0–46.0)
Hemoglobin: 11.2 g/dL — ABNORMAL LOW (ref 12.0–15.0)
Lymphocytes Relative: 30.4 % (ref 12.0–46.0)
Lymphs Abs: 2.1 10*3/uL (ref 0.7–4.0)
MCHC: 33.5 g/dL (ref 30.0–36.0)
MCV: 77.1 fl — ABNORMAL LOW (ref 78.0–100.0)
Monocytes Absolute: 0.5 10*3/uL (ref 0.1–1.0)
Monocytes Relative: 8 % (ref 3.0–12.0)
Neutro Abs: 4.2 10*3/uL (ref 1.4–7.7)
Neutrophils Relative %: 60.7 % (ref 43.0–77.0)
Platelets: 182 10*3/uL (ref 150.0–400.0)
RBC: 4.35 Mil/uL (ref 3.87–5.11)
RDW: 15 % (ref 11.5–15.5)
WBC: 6.9 10*3/uL (ref 4.0–10.5)

## 2016-01-23 NOTE — Patient Instructions (Addendum)
A few things to remember from today's visit:   [name] I have seen you today for an acute visit because your primary care provider was not available. Monitor for signs of worsening symptoms and seek immediate medical attention if any concerning/warning symptom as we discussed.Please schedule a follow up appointment with your doctor in 3-4 weeks, before if symptoms get worse.    1. Chest discomfort Chest pain reported today does not seem cardiac.  - EKG 12-Lead: no abnormalities seen, no changes when compared with EKG's done in the past.      It is a common symptom associated with multiple factors: psychologic,medications, systemic illness, sleep disorders,infections, and unknown causes. Because history of anemia today we will check this again. Regular physical activity as tolerated and a healthy diet is usually might help and usually recommended for chronic fatigue.

## 2016-01-23 NOTE — Progress Notes (Signed)
HPI:  ACUTE VISIT:  Chief Complaint  Patient presents with  . Fatigue    chest/back discomfort, tingling in hands, headaches, night sweats, blurry vision.    [name] is a 55 y.o. female, who is here today complaining of fatigue ad chest pain.  Fatigue: According to pt, she has had fatigue for a while but seems to be worse for the past month. When asked about depression or anxiety, she states that she might be a "little bit depressed", she lost her sister last year in November. She is also reporting sleep problems, she states that it takes her a while to fall asleep and sometimes she wakes up during the night. She denies a skin rash, insect bites, recent upper respiratory infection, or arthralgias. No loud snoring or sleep apnea.  + Night sweats, no fever or chills. Hx of anemia.  Denies abdominal pain, nausea, vomiting, changes in bowel habits, blood in stool or melena.  + Headache, last night, parietal, dull, relived with local massage.   + right cervical pain, no radiated.She has Flexeril but has not taken it lately.  Chest pain:  Intermittently  Chest "discomfort" or pressure, mild, mid chest, non radiated, and for at least a month, she is not sure if it is related to pectoral muscle exercises she does sometimes. She has not noted a pattern, she denies any injury.  It happens at rest, she denies any chest discomfort while exercising or with exertion. Also "sometimes" she feels pain on right trapezium, not sure if this is related to her chest pain.   No associated diaphoresis, palpitations, dyspnea, or syncope. She states that occasionally she feels "little dizzy"  Sometimes numbness on right upper extremity, usually when she is sleeping on right side and relieved after she changes positions.  She denies cough, wheezing, or tobacco use.  Denies Hx of HTN,DM, or HLD. She has history of sickle cell trait. FHx negative for CAD.  She states that she seems  to have similar symptoms annually around this time, she thinks it is related to stress.  Denies severe/frequent headache, hemianopsia or double vision, claudication, focal weakness, or edema.  CXR: Negative, 01/29/15. EKG 01/19/15, otherwise normal.   Lab Results  Component Value Date   WBC 6.3 04/05/2015   HGB 11.1* 04/05/2015   HCT 33.0* 04/05/2015   MCV 77.0* 04/05/2015   PLT 172.0 04/05/2015   Lab Results  Component Value Date   TSH 1.67 04/05/2015     Chemistry      Component Value Date/Time   NA 141 01/29/2015 1155   K 3.5 01/29/2015 1155   CL 104 01/29/2015 1155   CO2 28 01/29/2015 1155   BUN 14 01/29/2015 1155   CREATININE 0.72 01/29/2015 1155   CREATININE 0.80 04/02/2011 1730      Component Value Date/Time   CALCIUM 9.5 01/29/2015 1155   ALKPHOS 65 03/17/2014 0820   AST 21 03/17/2014 0820   ALT 15 03/17/2014 0820   BILITOT 1.3* 03/17/2014 0820      Fullness in throat area, no heartburn, no dysphagia, and pain does not seem associated with food intake.   Review of Systems  Constitutional: Positive for fatigue. Negative for fever, activity change, appetite change and unexpected weight change.  HENT: Negative for mouth sores, nosebleeds and trouble swallowing.   Eyes: Negative for redness and visual disturbance.  Respiratory: Negative for cough, shortness of breath and wheezing.   Cardiovascular: Positive for chest pain. Negative for palpitations  and leg swelling.  Gastrointestinal: Negative for nausea, vomiting, abdominal pain and blood in stool.       Negative for changes in bowel habits.  Genitourinary: Negative for dysuria, hematuria, decreased urine volume and difficulty urinating.  Musculoskeletal: Positive for back pain and neck pain. Negative for arthralgias and gait problem.  Skin: Negative for color change and rash.  Neurological: Positive for dizziness and headaches (last night,resolved). Negative for seizures, syncope and weakness.  Hematological:  Negative for adenopathy. Does not bruise/bleed easily.  Psychiatric/Behavioral: Positive for sleep disturbance. Negative for confusion. The patient is nervous/anxious.       Current Outpatient Prescriptions on File Prior to Visit  Medication Sig Dispense Refill  . cetirizine (ZYRTEC) 10 MG tablet Take 10 mg by mouth daily as needed for allergies.    . cyclobenzaprine (FLEXERIL) 10 MG tablet Take 1 tablet (10 mg total) by mouth 3 (three) times daily as needed for muscle spasms. 60 tablet 0  . fluticasone (FLONASE) 50 MCG/ACT nasal spray Place 2 sprays into both nostrils daily. 16 g 6  . Folic Acid-Vit B6-Vit B12 (FOLBEE) 2.5-25-1 MG TABS tablet Take 1 tablet by mouth daily. 30 tablet 4  . ibuprofen (ADVIL,MOTRIN) 200 MG tablet Take 400-600 mg by mouth every 6 (six) hours as needed for headache or moderate pain.    . Multiple Vitamin (MULTIVITAMIN WITH MINERALS) TABS tablet Take 1 tablet by mouth daily.     No current facility-administered medications on file prior to visit.     Past Medical History  Diagnosis Date  . Sickle-cell trait (HCC) 04/22/2007  . ANEMIA-NOS 04/22/2007  . Headache(784.0) 07/13/2007  . GERD 04/22/2007  . Menopause   . Sickle cell trait (HCC)    No Known Allergies  Social History   Social History  . Marital Status: Divorced    Spouse Name: N/A  . Number of Children: N/A  . Years of Education: N/A   Social History Main Topics  . Smoking status: Never Smoker   . Smokeless tobacco: None  . Alcohol Use: No  . Drug Use: No  . Sexual Activity: Yes   Other Topics Concern  . None   Social History Narrative    Filed Vitals:   01/23/16 0937  BP: 128/82  Pulse: 78  Temp: 98.1 F (36.7 C)  Resp: 12   Body mass index is 25.82 kg/(m^2).  SpO2 Readings from Last 3 Encounters:  01/23/16 98%  01/29/15 100%  12/22/14 97%      Physical Exam  Constitutional: She is oriented to person, place, and time. She appears well-developed and well-nourished.  No distress.  HENT:  Head: Atraumatic.  Mouth/Throat: Oropharynx is clear and moist and mucous membranes are normal.  Eyes: Conjunctivae and EOM are normal. Pupils are equal, round, and reactive to light.  Neck: No tracheal deviation present. Thyromegaly (mild, no modules.) present.  Cardiovascular: Normal rate and regular rhythm.   No murmur heard. Respiratory: Effort normal and breath sounds normal. No respiratory distress. She exhibits no tenderness.  GI: Soft. She exhibits no mass. There is no tenderness.  Musculoskeletal: She exhibits no edema.       Cervical back: She exhibits normal range of motion, no bony tenderness and no deformity.  Mild tenderness with palpation of left cervical paraspinal muscles.  Lymphadenopathy:    She has no cervical adenopathy.  Neurological: She is alert and oriented to person, place, and time. She has normal strength. Coordination and gait normal.  Skin: Skin is warm.  No erythema.  Psychiatric: Her speech is normal. Her mood appears anxious.  Well groomed, good eye contact.      ASSESSMENT AND PLAN:   Lab Results  Component Value Date   WBC 6.9 01/23/2016   HGB 11.2* 01/23/2016   HCT 33.5* 01/23/2016   MCV 77.1* 01/23/2016   PLT 182.0 01/23/2016     Luva was seen today for fatigue.  Diagnoses and all orders for this visit:  Chest discomfort  We discussed possible causes, it doe snot seem to be cardiac. EKG today SR, no signs of ischemia, normal otherwise. No major changes with prior one done in 01/2015.  Clearly instructed about warning signs.   -     EKG 12-Lead  Other fatigue  Seems chronic. Possible causes discussed including sleep disorder and psychiatric disorders.  Because Hx of anemia, cbc done today. She has had some work-up in 03/2015, including TSH and within normal limits. F/U in 4 weeks. She may benefit from treatment with SSRIs or SNRIs.   -     CBC with Differential/Platelet  Cervicalgia  Local heat, she  can also take Flexeril at bedtime.Some side effects discussed. Caution with certain exercise, may aggravate pain. F/U in 4 weeks.  Other acquired hemolytic anemias (HCC)  Further recommendations will be given accordingly.  -     CBC with Differential/Platelet        -Ms.Illene RegulusBonita E Melaragno advised to return or notify a doctor immediately if symptoms worsen or persist or new concerns arise, she voices understanding.       Jowanda Heeg G. [place]az[place], MD  [facility]. [place] office.

## 2016-01-23 NOTE — Progress Notes (Signed)
Pre visit review using our clinic review tool, if applicable. No additional management support is needed unless otherwise documented below in the visit note. 

## 2016-01-30 ENCOUNTER — Ambulatory Visit (INDEPENDENT_AMBULATORY_CARE_PROVIDER_SITE_OTHER): Payer: 59 | Admitting: Adult Health

## 2016-01-30 ENCOUNTER — Ambulatory Visit (INDEPENDENT_AMBULATORY_CARE_PROVIDER_SITE_OTHER)
Admission: RE | Admit: 2016-01-30 | Discharge: 2016-01-30 | Disposition: A | Discharge status: Home / Self Care | Payer: 59 | Source: Ambulatory Visit | Attending: Adult Health | Admitting: Adult Health

## 2016-01-30 ENCOUNTER — Encounter: Payer: Self-pay | Admitting: Adult Health

## 2016-01-30 VITALS — BP 100/62 | Temp 98.1°F | Ht 63.25 in | Wt 149.7 lb

## 2016-01-30 DIAGNOSIS — M79641 Pain in right hand: Secondary | ICD-10-CM | POA: Diagnosis not present

## 2016-01-30 NOTE — Progress Notes (Signed)
Subjective:    Patient ID: Deanna Watkins, female    DOB: 06/21/1961, 55 y.o.   MRN: [id number]  HPI  55 year old female, patient of Dr. Selena BattenKim, who presents to the office today for an acute complaint of right index finger pain. She reports that earlier this week she was walking up the stairs at home when she tripped and fell. She caught herself but in the process her right index finger was bent backwards. She has had pain and swelling to the finger every since. She is concerned for fracutre  She has not been using any OTC or ice   Denies any other injuries    Review of Systems  Constitutional: Negative.   Musculoskeletal: Positive for joint swelling and arthralgias.  Skin: Negative.   All other systems reviewed and are negative.  Past Medical History  Diagnosis Date  . Sickle-cell trait (HCC) 04/22/2007  . ANEMIA-NOS 04/22/2007  . Headache(784.0) 07/13/2007  . GERD 04/22/2007  . Menopause   . Sickle cell trait [facility](HCC)     Social History   Social History  . Marital Status: Divorced    Spouse Name: N/A  . Number of Children: N/A  . Years of Education: N/A   Occupational History  . Not on file.   Social History Main Topics  . Smoking status: Never Smoker   . Smokeless tobacco: Not on file  . Alcohol Use: No  . Drug Use: No  . Sexual Activity: Yes   Other Topics Concern  . Not on file   Social History Narrative    Past Surgical History  Procedure Laterality Date  . Cholecystectomy    . Abdominal hysterectomy  1999  . Oophorectomy  2006    Family History  Problem Relation Age of Onset  . Cancer Mother     colon  . Cancer Sister     breast    No Known Allergies  Current Outpatient Prescriptions on File Prior to Visit  Medication Sig Dispense Refill  . cetirizine (ZYRTEC) 10 MG tablet Take 10 mg by mouth daily as needed for allergies.    . Cholecalciferol (VITAMIN D3) 2000 units TABS Take 1 tablet by mouth daily.    . cyclobenzaprine (FLEXERIL) 10 MG  tablet Take 1 tablet (10 mg total) by mouth 3 (three) times daily as needed for muscle spasms. 60 tablet 0  . fluticasone (FLONASE) 50 MCG/ACT nasal spray Place 2 sprays into both nostrils daily. 16 g 6  . Folic Acid-Vit B6-Vit B12 (FOLBEE) 2.5-25-1 MG TABS tablet Take 1 tablet by mouth daily. 30 tablet 4  . ibuprofen (ADVIL,MOTRIN) 200 MG tablet Take 400-600 mg by mouth every 6 (six) hours as needed for headache or moderate pain.    . Multiple Vitamin (MULTIVITAMIN WITH MINERALS) TABS tablet Take 1 tablet by mouth daily.    . vitamin B-12 (CYANOCOBALAMIN) 500 MCG tablet Take 500 mcg by mouth daily.     No current facility-administered medications on file prior to visit.    BP 100/62 mmHg  Temp(Src) 98.1 F (36.7 C) (Oral)  Ht 5' 3.25" (1.607 m)  Wt 149 lb 11.2 oz (67.903 kg)  BMI 26.29 kg/m2       Objective:   Physical Exam  Constitutional: She is oriented to person, place, and time. She appears well-developed and well-nourished. No distress.  Musculoskeletal: Normal range of motion. She exhibits edema and tenderness.  Trace swelling and tenderness to the proximal phalange. No decrease in ROM  Neurological:  She is alert and oriented to person, place, and time.  Skin: Skin is warm and dry. No rash noted. She is not diaphoretic. No erythema. No pallor.  Psychiatric: She has a normal mood and affect. Her behavior is normal. Judgment and thought content normal.  Nursing note and vitals reviewed.      Assessment & Plan:  1. Right hand pain - DG Hand Complete Right; Future - No acute fracture or dislocation  - Likely hyper extended - Ice and Motrin - buddy tape  - Follow up with PCP if no improvement   Shirline Frees, NP

## 2016-02-15 ENCOUNTER — Ambulatory Visit: Payer: Self-pay | Admitting: Family Medicine

## 2016-02-21 ENCOUNTER — Ambulatory Visit (INDEPENDENT_AMBULATORY_CARE_PROVIDER_SITE_OTHER): Payer: 59 | Admitting: Family Medicine

## 2016-02-21 ENCOUNTER — Other Ambulatory Visit: Payer: 59

## 2016-02-21 ENCOUNTER — Encounter: Payer: Self-pay | Admitting: Family Medicine

## 2016-02-21 VITALS — BP 104/70 | HR 108 | Temp 97.7°F | Ht 63.25 in | Wt 151.8 lb

## 2016-02-21 DIAGNOSIS — R42 Dizziness and giddiness: Secondary | ICD-10-CM

## 2016-02-21 DIAGNOSIS — R479 Unspecified speech disturbances: Secondary | ICD-10-CM | POA: Diagnosis not present

## 2016-02-21 DIAGNOSIS — R5383 Other fatigue: Secondary | ICD-10-CM

## 2016-02-21 LAB — VITAMIN B12: Vitamin B-12: 832 pg/mL (ref 211–911)

## 2016-02-21 LAB — TSH: TSH: 2.75 u[IU]/mL (ref 0.35–4.50)

## 2016-02-21 LAB — HEMOGLOBIN A1C: Hgb A1c MFr Bld: 2.3 % — ABNORMAL LOW (ref 4.6–6.5)

## 2016-02-21 MED ORDER — MECLIZINE HCL 25 MG PO TABS: 25.0000 mg | ORAL_TABLET | Freq: Three times a day (TID) | ORAL | 0 refills | Status: DC | PRN

## 2016-02-21 NOTE — Patient Instructions (Signed)
BEFORE YOU LEAVE: -follow up: 2-4 weeks -labs  Do not drive with vertigo.  Seek care immediately if worsening or new symptoms.  -We placed a referral for you as discussed for the MRI of the brain. Please call our office if you have not been contacted regarding details of this exam in the next 1 week.  We have ordered labs or studies at this visit. It can take up to 1-2 weeks for results and processing. IF results require follow up or explanation, we will call you with instructions. Clinically stable results will be released to your [facility]. If you have not heard from [place] or cannot find your results in [facility] in 2 weeks please contact our office at [phone].  If you are not yet signed up for [facility], please consider signing up.

## 2016-02-21 NOTE — Progress Notes (Signed)
Pre visit review using our clinic review tool, if applicable. No additional management support is needed unless otherwise documented below in the visit note. 

## 2016-02-21 NOTE — Progress Notes (Signed)
HPI:  Deanna Watkins is a pleasant 55 yo here for an acute visit for a number of issues. For the past 2-3 weeks she reports she has had fatigue, intermittent spells of vertigo and nausea triggered by movement, trouble getting words out "stuttering" that is not appreciated by others and a spell once of briefly feeling like she was "in a tunnel. She does admit to increased stress and anxiety, but does not feel symptoms are caused by this. All symptoms are now resolved except for occ vertigo with certain movement. She is worried about a stroke. She denies HA, fever, sinus issues, weakness, numbness, SOB, CP, vision changes, speech issues perceived by others, hearing loss, SI, hallucinations, wt loss, vomiting or panic attacks.  Of note, she has seen several other providers recently for a variety of complaints and cbc was done recently.  ROS: See pertinent positives and negatives per HPI.  Past Medical History:  Diagnosis Date  . ANEMIA-NOS 04/22/2007  . GERD 04/22/2007  . Headache(784.0) 07/13/2007  . Menopause   . Sickle cell trait ([place])   . Sickle-cell trait ([place]) 04/22/2007    Past Surgical History:  Procedure Laterality Date  . ABDOMINAL HYSTERECTOMY  1999  . CHOLECYSTECTOMY    . OOPHORECTOMY  2006    Family History  Problem Relation Age of Onset  . Cancer Mother     colon  . Cancer Sister     breast    Social History   Social History  . Marital status: Divorced    Spouse name: N/A  . Number of children: N/A  . Years of education: N/A   Social History Main Topics  . Smoking status: Never Smoker  . Smokeless tobacco: None  . Alcohol use No  . Drug use: No  . Sexual activity: Yes   Other Topics Concern  . None   Social History Narrative  . None     Current Outpatient Prescriptions:  .  cetirizine (ZYRTEC) 10 MG tablet, Take 10 mg by mouth daily as needed for allergies., Disp: , Rfl:  .  Cholecalciferol (VITAMIN D3) 2000 units TABS, Take 1 tablet by mouth  daily., Disp: , Rfl:  .  cyclobenzaprine (FLEXERIL) 10 MG tablet, Take 1 tablet (10 mg total) by mouth 3 (three) times daily as needed for muscle spasms., Disp: 60 tablet, Rfl: 0 .  fluticasone (FLONASE) 50 MCG/ACT nasal spray, Place 2 sprays into both nostrils daily., Disp: 16 g, Rfl: 6 .  Folic Acid-Vit U0-AVW U98 (FOLBEE) 2.5-25-1 MG TABS tablet, Take 1 tablet by mouth daily., Disp: 30 tablet, Rfl: 4 .  ibuprofen (ADVIL,MOTRIN) 200 MG tablet, Take 400-600 mg by mouth every 6 (six) hours as needed for headache or moderate pain., Disp: , Rfl:  .  Multiple Vitamin (MULTIVITAMIN WITH MINERALS) TABS tablet, Take 1 tablet by mouth daily., Disp: , Rfl:  .  vitamin B-12 (CYANOCOBALAMIN) 500 MCG tablet, Take 500 mcg by mouth daily., Disp: , Rfl:  .  meclizine (ANTIVERT) 25 MG tablet, Take 1 tablet (25 mg total) by mouth 3 (three) times daily as needed for dizziness., Disp: 30 tablet, Rfl: 0  EXAM:  Vitals:   02/21/16 0755  BP: 104/70  Pulse: (!) 108  Temp: 97.7 F (36.5 C)  Pulse around 90 on my exam  Body mass index is 26.68 kg/m.  GENERAL: vitals reviewed and listed above, alert, oriented, appears well hydrated and in no acute distress  HEENT: atraumatic, conjunttiva clear, no obvious abnormalities on inspection of  external nose and ears  NECK: no obvious masses on inspection, no bruit  LUNGS: clear to auscultation bilaterally, no wheezes, rales or rhonchi, good air movement  CV: HRRR, no peripheral edema  MS: moves all extremities without noticeable abnormality  PSYCH/NEURO: pleasant and cooperative, no obvious depression or anxiety, gait normal, movements grossly normal, speech and thought processing grossly intact, CN II-XII grossly intact, finger to nose normal, dix hallpike results in reproduction of symptoms when to the R and slow rotation nystagmus bilaterally.  ASSESSMENT AND PLAN:  Discussed the following assessment and plan:  Vertigo - Plan: MR Brain W Wo  Contrast  Speech abnormality - Plan: MR Brain W Wo Contrast  Other fatigue - Plan: CMP with eGFR, TSH, Hemoglobin A1c, Vitamin B12  -we discussed possible serious and likely etiologies, workup and treatment, treatment risks and return precautions -after this discussion, Deanna Watkins opted for labs, MRI and possible neurology referral if persistent symptoms - today she feels everything is much improved -follow up advised in 2-4 weeks -of course, we advised Deanna Watkins  to return or notify a doctor immediately if symptoms worsen or persist or new concerns arise.   Patient Instructions  BEFORE YOU LEAVE: -follow up: 2-4 weeks -labs  Do not drive with vertigo.  Seek care immediately if worsening or new symptoms.  -We placed a referral for you as discussed for the MRI of the brain. Please call our office if you have not been contacted regarding details of this exam in the next 1 week.  We have ordered labs or studies at this visit. It can take up to 1-2 weeks for results and processing. IF results require follow up or explanation, we will call you with instructions. Clinically stable results will be released to your [facility]. If you have not heard from [place] or cannot find your results in [facility] in 2 weeks please contact our office at [phone].  If you are not yet signed up for [facility], please consider signing up.           Colin Benton R., DO

## 2016-02-22 LAB — HEMOGLOBIN A1C
Hgb A1c MFr Bld: 4.6 % (ref <5.7)
Mean Plasma Glucose: 85 mg/dL

## 2016-02-22 LAB — COMPLETE METABOLIC PANEL WITH GFR
ALT: 19 U/L (ref 6–29)
AST: 27 U/L (ref 10–35)
Albumin: 4.2 g/dL (ref 3.6–5.1)
Alkaline Phosphatase: 75 U/L (ref 33–130)
BUN: 13 mg/dL (ref 7–25)
CO2: 27 mmol/L (ref 20–31)
Calcium: 9.2 mg/dL (ref 8.6–10.4)
Chloride: 105 mmol/L (ref 98–110)
Creat: 0.6 mg/dL (ref 0.50–1.05)
GFR, Est African American: >89 mL/min (ref >=60)
GFR, Est Non African American: >89 mL/min (ref >=60)
Glucose, Bld: 89 mg/dL (ref 65–99)
Potassium: 3.7 mmol/L (ref 3.5–5.3)
Sodium: 142 mmol/L (ref 135–146)
Total Bilirubin: 1 mg/dL (ref 0.2–1.2)
Total Protein: 7.3 g/dL (ref 6.1–8.1)

## 2016-03-02 ENCOUNTER — Ambulatory Visit
Admission: RE | Admit: 2016-03-02 | Discharge: 2016-03-02 | Disposition: A | Discharge status: Home / Self Care | Payer: 59 | Source: Ambulatory Visit | Attending: Family Medicine | Admitting: Family Medicine

## 2016-03-02 DIAGNOSIS — R479 Unspecified speech disturbances: Secondary | ICD-10-CM

## 2016-03-02 DIAGNOSIS — R42 Dizziness and giddiness: Secondary | ICD-10-CM

## 2016-03-02 MED ORDER — GADOBENATE DIMEGLUMINE 529 MG/ML IV SOLN
13.0000 mL | Freq: Once | INTRAVENOUS | Status: AC | PRN
  Administered 2016-03-02: 13 mL via INTRAVENOUS

## 2016-03-11 ENCOUNTER — Encounter: Payer: Self-pay | Admitting: Family Medicine

## 2016-03-11 DIAGNOSIS — R42 Dizziness and giddiness: Secondary | ICD-10-CM

## 2016-03-11 DIAGNOSIS — G43809 Other migraine, not intractable, without status migrainosus: Secondary | ICD-10-CM

## 2016-03-11 DIAGNOSIS — R4789 Other speech disturbances: Secondary | ICD-10-CM

## 2016-03-16 NOTE — Progress Notes (Signed)
HPI:   Follow up: Vertigo/Word finding difficulty: -MRI without acute findings, labs unrevealing -she has been referred to neurology for further evaluation -reports: improved some, but still sporadic brief episodes vertigo and word finding difficulty, migraines a few times per week - not atypical for her - relieved with goodies; tinnitus - chronic, reports eval with ENT and neurology remotely -denies: worsening symptoms, severe headaches or change in headaches, vision changes, hearing loss, fevers, weight loss, weakness, numbness -hx migraines  Increased stress: -admitted to this at last visit -reports: worse the last 1 year -has not seen her counselor (Dr. Jason Fila) in some time, agrees to schedule appt -denies depression, SI, hallucination, panic   ROS: See pertinent positives and negatives per HPI.  Past Medical History:  Diagnosis Date  . ANEMIA-NOS 04/22/2007  . GERD 04/22/2007  . Headache(784.0) 07/13/2007  . Menopause   . Sickle cell trait (HCC)   . Sickle-cell trait (HCC) 04/22/2007    Past Surgical History:  Procedure Laterality Date  . ABDOMINAL HYSTERECTOMY  1999  . CHOLECYSTECTOMY    . OOPHORECTOMY  2006    Family History  Problem Relation Age of Onset  . Cancer Mother     colon  . Cancer Sister     breast    Social History   Social History  . Marital status: Divorced    Spouse name: N/A  . Number of children: N/A  . Years of education: N/A   Social History Main Topics  . Smoking status: Never Smoker  . Smokeless tobacco: None  . Alcohol use No  . Drug use: No  . Sexual activity: Yes   Other Topics Concern  . None   Social History Narrative  . None     Current Outpatient Prescriptions:  .  cetirizine (ZYRTEC) 10 MG tablet, Take 10 mg by mouth daily as needed for allergies., Disp: , Rfl:  .  Cholecalciferol (VITAMIN D3) 2000 units TABS, Take 1 tablet by mouth daily., Disp: , Rfl:  .  cyclobenzaprine (FLEXERIL) 10 MG tablet, Take 1 tablet  (10 mg total) by mouth 3 (three) times daily as needed for muscle spasms., Disp: 60 tablet, Rfl: 0 .  fluticasone (FLONASE) 50 MCG/ACT nasal spray, Place 2 sprays into both nostrils daily., Disp: 16 g, Rfl: 6 .  Folic Acid-Vit B6-Vit B12 (FOLBEE) 2.5-25-1 MG TABS tablet, Take 1 tablet by mouth daily., Disp: 30 tablet, Rfl: 4 .  ibuprofen (ADVIL,MOTRIN) 200 MG tablet, Take 400-600 mg by mouth every 6 (six) hours as needed for headache or moderate pain., Disp: , Rfl:  .  meclizine (ANTIVERT) 25 MG tablet, Take 1 tablet (25 mg total) by mouth 3 (three) times daily as needed for dizziness., Disp: 30 tablet, Rfl: 0 .  Multiple Vitamin (MULTIVITAMIN WITH MINERALS) TABS tablet, Take 1 tablet by mouth daily., Disp: , Rfl:  .  vitamin B-12 (CYANOCOBALAMIN) 500 MCG tablet, Take 500 mcg by mouth daily., Disp: , Rfl:   EXAM:  Vitals:   03/17/16 0756  BP: 100/64  Pulse: 97  Temp: 98 F (36.7 C)    Body mass index is 26.7 kg/m.  GENERAL: vitals reviewed and listed above, alert, oriented, appears well hydrated and in no acute distress  HEENT: atraumatic, conjunttiva clear, PERRLA, no obvious abnormalities on inspection of external nose and ears  NECK: no obvious masses on inspection  LUNGS: clear to auscultation bilaterally, no wheezes, rales or rhonchi, good air movement  CV: HRRR, no peripheral edema  MS: moves all  extremities without noticeable abnormality  PSYCH: pleasant and cooperative, no obvious depression or anxiety  ASSESSMENT AND PLAN:  Discussed the following assessment and plan:  Chronic nonintractable headache, unspecified headache type  Vertigo  Word finding difficulty  Stress  Tinnitus, bilateral  -seeing neurology later this week for further eval odd symptoms, query related to migraines, stress vs other -advised CBT for stress and she agrees to scheduled -consider ENT re-eval if neurology eval unrevealing and tinnitus persists -assistant to obtain gyn records and  update -Patient advised to return or notify a doctor immediately if symptoms worsen or persist or new concerns arise.  Patient Instructions  BEFORE YOU LEAVE: -follow up: 4-6 months, and as needed -Ronnald CollumJo anne, please obtain pap, mammo, gyn physical to update HM  See the neurologist as scheduled about the vertigo and speech issues.  See Dr. Jason FilaBray for counseling.  We recommend the following healthy lifestyle: 1) Small portions - eat off of salad plate instead of dinner plate 2) Eat a healthy clean diet with avoidance of (less then 1 serving per week) processed foods, sweetened drinks, white starches, red meat, fast foods and sweets and consisting of: * 5-9 servings per day of fresh or frozen fruits and vegetables (not corn or potatoes, not dried or canned) *nuts and seeds, beans *olives and olive oil *small portions of lean meats such as fish and white chicken  *small portions of whole grains 3)Get at least 150 minutes of sweaty aerobic exercise per week 4)reduce stress - counseling, meditation, relaxation to balance other aspects of your life     Kriste BasqueKIM, HANNAH R., DO

## 2016-03-17 ENCOUNTER — Ambulatory Visit (INDEPENDENT_AMBULATORY_CARE_PROVIDER_SITE_OTHER): Payer: 59 | Admitting: Family Medicine

## 2016-03-17 ENCOUNTER — Encounter: Payer: Self-pay | Admitting: Family Medicine

## 2016-03-17 VITALS — BP 100/64 | HR 97 | Temp 98.0°F | Ht 63.25 in | Wt 151.9 lb

## 2016-03-17 DIAGNOSIS — Z658 Other specified problems related to psychosocial circumstances: Secondary | ICD-10-CM | POA: Diagnosis not present

## 2016-03-17 DIAGNOSIS — R51 Headache: Secondary | ICD-10-CM

## 2016-03-17 DIAGNOSIS — R42 Dizziness and giddiness: Secondary | ICD-10-CM | POA: Diagnosis not present

## 2016-03-17 DIAGNOSIS — G8929 Other chronic pain: Secondary | ICD-10-CM

## 2016-03-17 DIAGNOSIS — F439 Reaction to severe stress, unspecified: Secondary | ICD-10-CM

## 2016-03-17 DIAGNOSIS — R4789 Other speech disturbances: Secondary | ICD-10-CM

## 2016-03-17 DIAGNOSIS — H9313 Tinnitus, bilateral: Secondary | ICD-10-CM

## 2016-03-17 NOTE — Patient Instructions (Signed)
BEFORE YOU LEAVE: -follow up: 4-6 months, and as needed -Ronnald CollumJo anne, please obtain pap, mammo, gyn physical to update HM  See the neurologist as scheduled about the vertigo and speech issues.  See Dr. Jason FilaBray for counseling.  We recommend the following healthy lifestyle: 1) Small portions - eat off of salad plate instead of dinner plate 2) Eat a healthy clean diet with avoidance of (less then 1 serving per week) processed foods, sweetened drinks, white starches, red meat, fast foods and sweets and consisting of: * 5-9 servings per day of fresh or frozen fruits and vegetables (not corn or potatoes, not dried or canned) *nuts and seeds, beans *olives and olive oil *small portions of lean meats such as fish and white chicken  *small portions of whole grains 3)Get at least 150 minutes of sweaty aerobic exercise per week 4)reduce stress - counseling, meditation, relaxation to balance other aspects of your life

## 2016-03-17 NOTE — Progress Notes (Signed)
Pre visit review using our clinic review tool, if applicable. No additional management support is needed unless otherwise documented below in the visit note. 

## 2016-03-19 NOTE — Progress Notes (Signed)
Deanna Watkins was seen today in neurologic consultation at the request of Terressa Koyanagi., DO.  The consultation is for the evaluation of episodes of vertigo and word finding trouble/stuttering speech.  Pt states that it started 1 month ago.  She states that she still has daily episodes of dizziness that last a few min.  If she holds the head down, she will feel the room spinning.  She has a long hx of tinnitus.  When she first had the episode, she would have word finding issues/stuttering with it.  That is getting better but it is still present.  Her right eye also "jumps" a lot.  She was given meclizine but has not tried it.  She also c/o hand paresthesias when she sleeps/gets up.  All of the fingers are involved.  It goes away once she awakens and moves the arm.  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    Neuroimaging has previously been performed.  It is available for my review today.  MRI brain was done on 03/02/16.  This was done with and without gad.   There were a few rare scattered T2 hyperintensities.    PREVIOUS MEDICATIONS: n/a  ALLERGIES:  No Known Allergies  CURRENT MEDICATIONS:  Outpatient Encounter Prescriptions as of 03/21/2016  Medication Sig  . cetirizine (ZYRTEC) 10 MG tablet Take 10 mg by mouth daily as needed for allergies.  . Cholecalciferol (VITAMIN D3) 2000 units TABS Take 1 tablet by mouth daily.  . cyclobenzaprine (FLEXERIL) 10 MG tablet Take 1 tablet (10 mg total) by mouth 3 (three) times daily as needed for muscle spasms.  . fluticasone (FLONASE) 50 MCG/ACT nasal spray Place 2 sprays into both nostrils daily.  . Folic Acid-Vit B6-Vit B12 (FOLBEE) 2.5-25-1 MG TABS tablet Take 1 tablet by mouth daily.  Marland Kitchen ibuprofen (ADVIL,MOTRIN) 200 MG tablet Take 400-600 mg by mouth every 6 (six) hours as needed for headache or moderate pain.  . Multiple Vitamin (MULTIVITAMIN WITH MINERALS) TABS tablet Take 1 tablet by mouth daily.  . vitamin B-12  (CYANOCOBALAMIN) 500 MCG tablet Take 500 mcg by mouth daily.  . meclizine (ANTIVERT) 25 MG tablet Take 1 tablet (25 mg total) by mouth 3 (three) times daily as needed for dizziness. (Patient not taking: Reported on 03/21/2016)   No facility-administered encounter medications on file as of 03/21/2016.     PAST MEDICAL HISTORY:   Past Medical History:  Diagnosis Date  . ANEMIA-NOS 04/22/2007  . GERD 04/22/2007  . Headache(784.0) 07/13/2007  . Menopause   . Sickle cell trait (HCC)   . Sickle-cell trait (HCC) 04/22/2007    PAST SURGICAL HISTORY:   Past Surgical History:  Procedure Laterality Date  . ABDOMINAL HYSTERECTOMY  1999  . CHOLECYSTECTOMY    . OOPHORECTOMY  2006    SOCIAL HISTORY:   Social History   Social History  . Marital status: Divorced    Spouse name: N/A  . Number of children: N/A  . Years of education: N/A   Occupational History  . computer systems analyst    Social History Main Topics  . Smoking status: Never Smoker  . Smokeless tobacco: Never Used  . Alcohol use No  . Drug use: No  . Sexual activity: Yes   Other Topics Concern  . Not on file   Social History Narrative  . No narrative on file    FAMILY HISTORY:   Family Status  Relation Status  .  Mother Deceased  . Father Alive  . Sister Deceased  . Sister Alive  . Daughter Alive    ROS:  A complete 10 system review of systems was obtained and was unremarkable apart from what is mentioned above.  PHYSICAL EXAMINATION:    VITALS:   Vitals:   03/21/16 1316  BP: 90/60  Pulse: 98  Weight: 151 lb (68.5 kg)  Height: 5\' 4"  (1.626 m)    GEN:  Normal appears female in no acute distress.  Appears stated age. HEENT:  Normocephalic, atraumatic. The mucous membranes are moist. The superficial temporal arteries are without ropiness or tenderness. Cardiovascular: Regular rate and rhythm. Lungs: Clear to auscultation bilaterally. Neck/Heme: There are no carotid bruits noted  bilaterally.  NEUROLOGICAL: Orientation:  The patient is alert and oriented x 3.  Fund of knowledge is appropriate.  Recent and remote memory intact.  Attention span and concentration normal.  Repeats and names without difficulty. Cranial nerves: There is good facial symmetry. The pupils are equal round and reactive to light bilaterally. Fundoscopic exam reveals clear disc margins bilaterally. Extraocular muscles are intact and visual fields are full to confrontational testing. Speech is fluent and clear. Soft palate rises symmetrically and there is no tongue deviation. Hearing is intact to conversational tone. Tone: Tone is good throughout. Sensation: Sensation is intact to light touch and pinprick throughout (facial, trunk, extremities). Vibration is intact at the bilateral big toe. There is no extinction with double simultaneous stimulation. There is no sensory dermatomal level identified. Coordination:  The patient has no difficulty with RAM's or FNF bilaterally. Motor: Strength is 5/5 in the bilateral upper and lower extremities.  Shoulder shrug is equal and symmetric. There is no pronator drift.  There are no fasciculations noted. DTR's: Deep tendon reflexes are 2/4 at the bilateral biceps, triceps, brachioradialis, patella and achilles.  Plantar responses are downgoing bilaterally. Gait and Station: The patient is able to ambulate without difficulty. The patient is able to heel toe walk without any difficulty. The patient is able to ambulate in a tandem fashion. The patient is able to stand in the Romberg position. A dix hall pike maneuver was performed and the patient had significant nystagmus to the left when the left ear was pointed down (posterior semicircular canal); however there was quick and early fatigue   IMPRESSION/PLAN  1. BPPV  -pt had a positive dix hall pike but there was quick and early fatigue of nystagmus and pt is clinically getting much better.  She opted to hold on  vestibular rehab for now.  She will let me know if she changes her mind.  Talked about nature and pathophysiology  2.  Hand paresthesias  -likely CTS.  Waking up with it.  She is at risk given occupation.  Gave RX for cock up wrist splint.  Will let me know if persists in daytime.    3.  F/u prn.  Much greater than 50% of this visit was spent in counseling and coordinating care.  Total face to face time:  60 min    Cc:  Kriste BasqueKIM, HANNAH R., DO

## 2016-03-21 ENCOUNTER — Ambulatory Visit (INDEPENDENT_AMBULATORY_CARE_PROVIDER_SITE_OTHER): Payer: 59 | Admitting: Neurology

## 2016-03-21 ENCOUNTER — Encounter: Payer: Self-pay | Admitting: Neurology

## 2016-03-21 VITALS — BP 90/60 | HR 98 | Ht 64.0 in | Wt 151.0 lb

## 2016-03-21 DIAGNOSIS — H8112 Benign paroxysmal vertigo, left ear: Secondary | ICD-10-CM

## 2016-03-21 DIAGNOSIS — G5603 Carpal tunnel syndrome, bilateral upper limbs: Secondary | ICD-10-CM

## 2016-03-21 MED ORDER — WRIST SPLINT/COCK-UP/RIGHT M MISC: 1.0000 | Freq: Every day | 0 refills | Status: DC

## 2016-03-21 MED ORDER — WRIST SPLINT/COCK-UP/LEFT M MISC: 1.0000 | Freq: Every day | 0 refills | Status: DC

## 2016-03-21 NOTE — Patient Instructions (Signed)
1. Prescriptions given for cock up wrist splint. Take these to a medical supply store and they will fit you for them.   2. Let us know if you are interested in vestibular rehab and we will send a referral.

## 2016-03-25 ENCOUNTER — Encounter: Payer: Self-pay | Admitting: Family Medicine

## 2016-07-11 ENCOUNTER — Ambulatory Visit: Payer: Self-pay | Admitting: Family Medicine

## 2016-08-08 DIAGNOSIS — L723 Sebaceous cyst: Secondary | ICD-10-CM | POA: Diagnosis not present

## 2016-09-11 ENCOUNTER — Other Ambulatory Visit: Payer: Self-pay | Admitting: Family Medicine

## 2016-09-11 ENCOUNTER — Ambulatory Visit
Admission: RE | Admit: 2016-09-11 | Discharge: 2016-09-11 | Disposition: A | Discharge status: Home / Self Care | Payer: 59 | Source: Ambulatory Visit | Attending: Family Medicine | Admitting: Family Medicine

## 2016-09-11 DIAGNOSIS — M25561 Pain in right knee: Secondary | ICD-10-CM

## 2016-09-11 DIAGNOSIS — M25562 Pain in left knee: Secondary | ICD-10-CM

## 2016-09-11 DIAGNOSIS — M25552 Pain in left hip: Secondary | ICD-10-CM | POA: Diagnosis not present

## 2016-09-11 DIAGNOSIS — M160 Bilateral primary osteoarthritis of hip: Secondary | ICD-10-CM | POA: Diagnosis not present

## 2016-09-11 DIAGNOSIS — M170 Bilateral primary osteoarthritis of knee: Secondary | ICD-10-CM | POA: Diagnosis not present

## 2016-09-11 DIAGNOSIS — M25551 Pain in right hip: Secondary | ICD-10-CM | POA: Diagnosis not present

## 2016-09-16 IMAGING — DX DG HAND COMPLETE 3+V*R*
3 series · 3 of 3 positions shown · non-contrast
Comparison: 08/26/2013

CLINICAL DATA: Fall 6 weeks ago. Right middle finger swelling and
pain.

EXAM:
RIGHT HAND - COMPLETE 3+ VIEW

[hand ap]
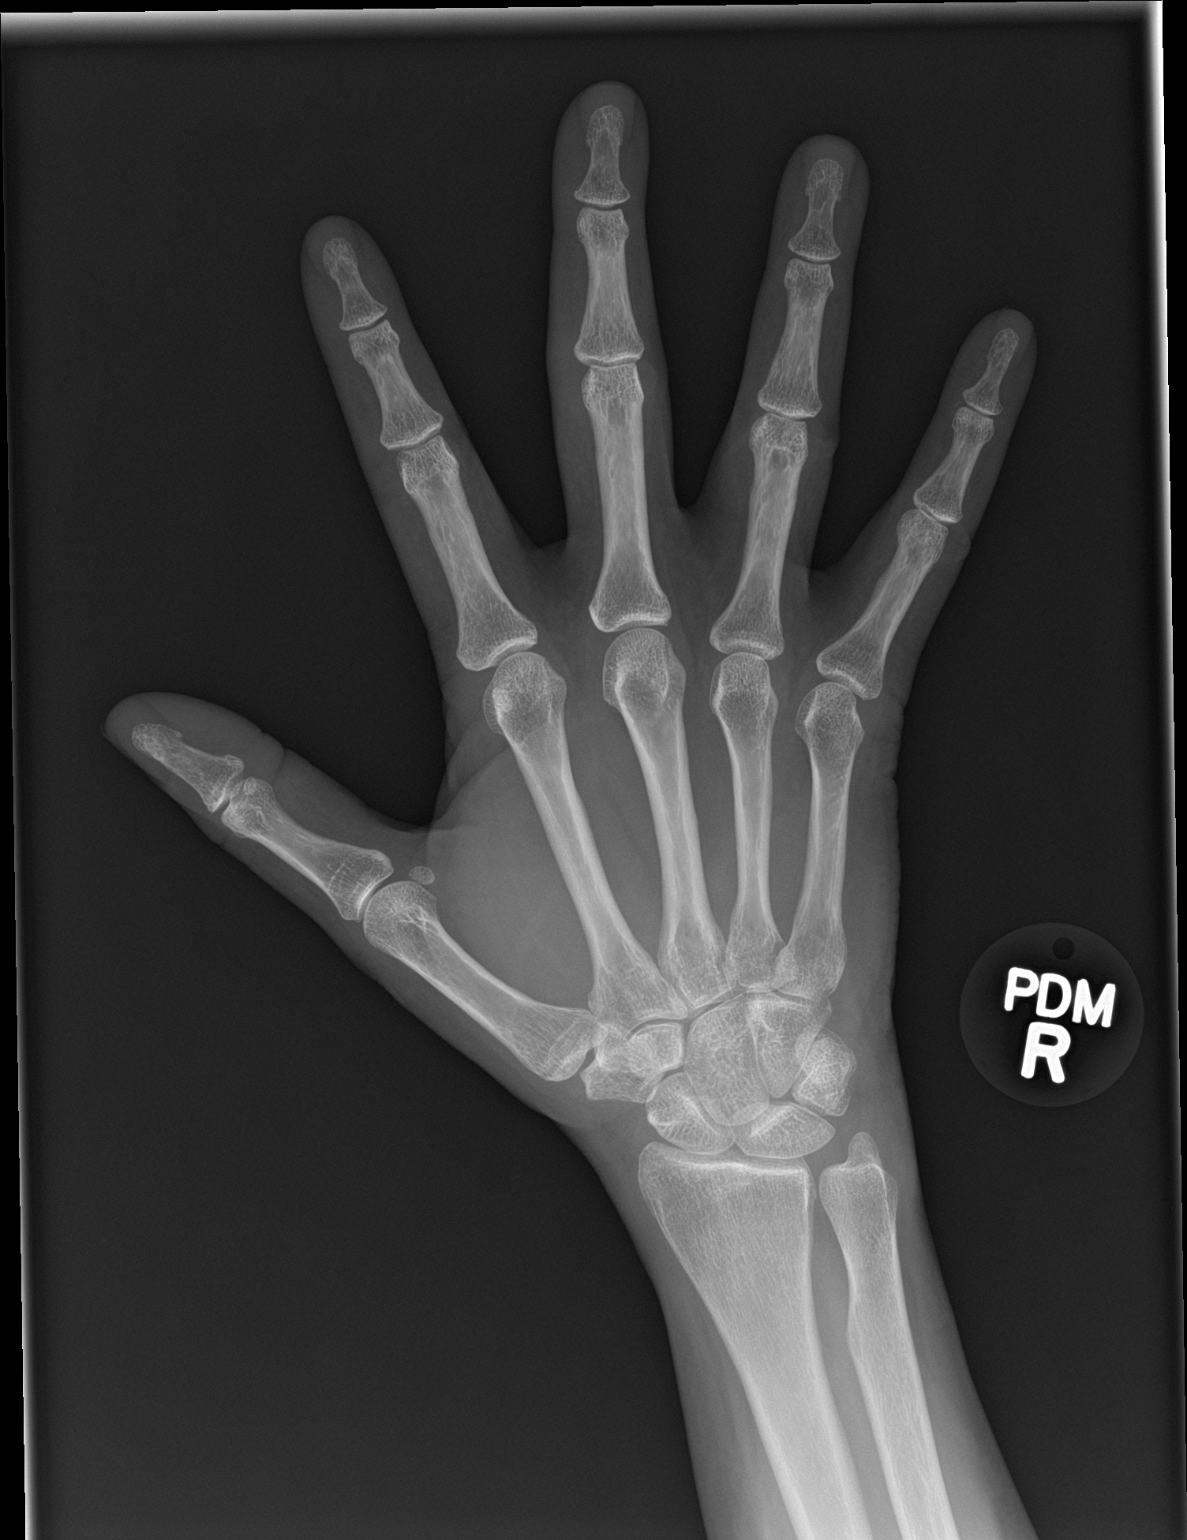

[hand obl]
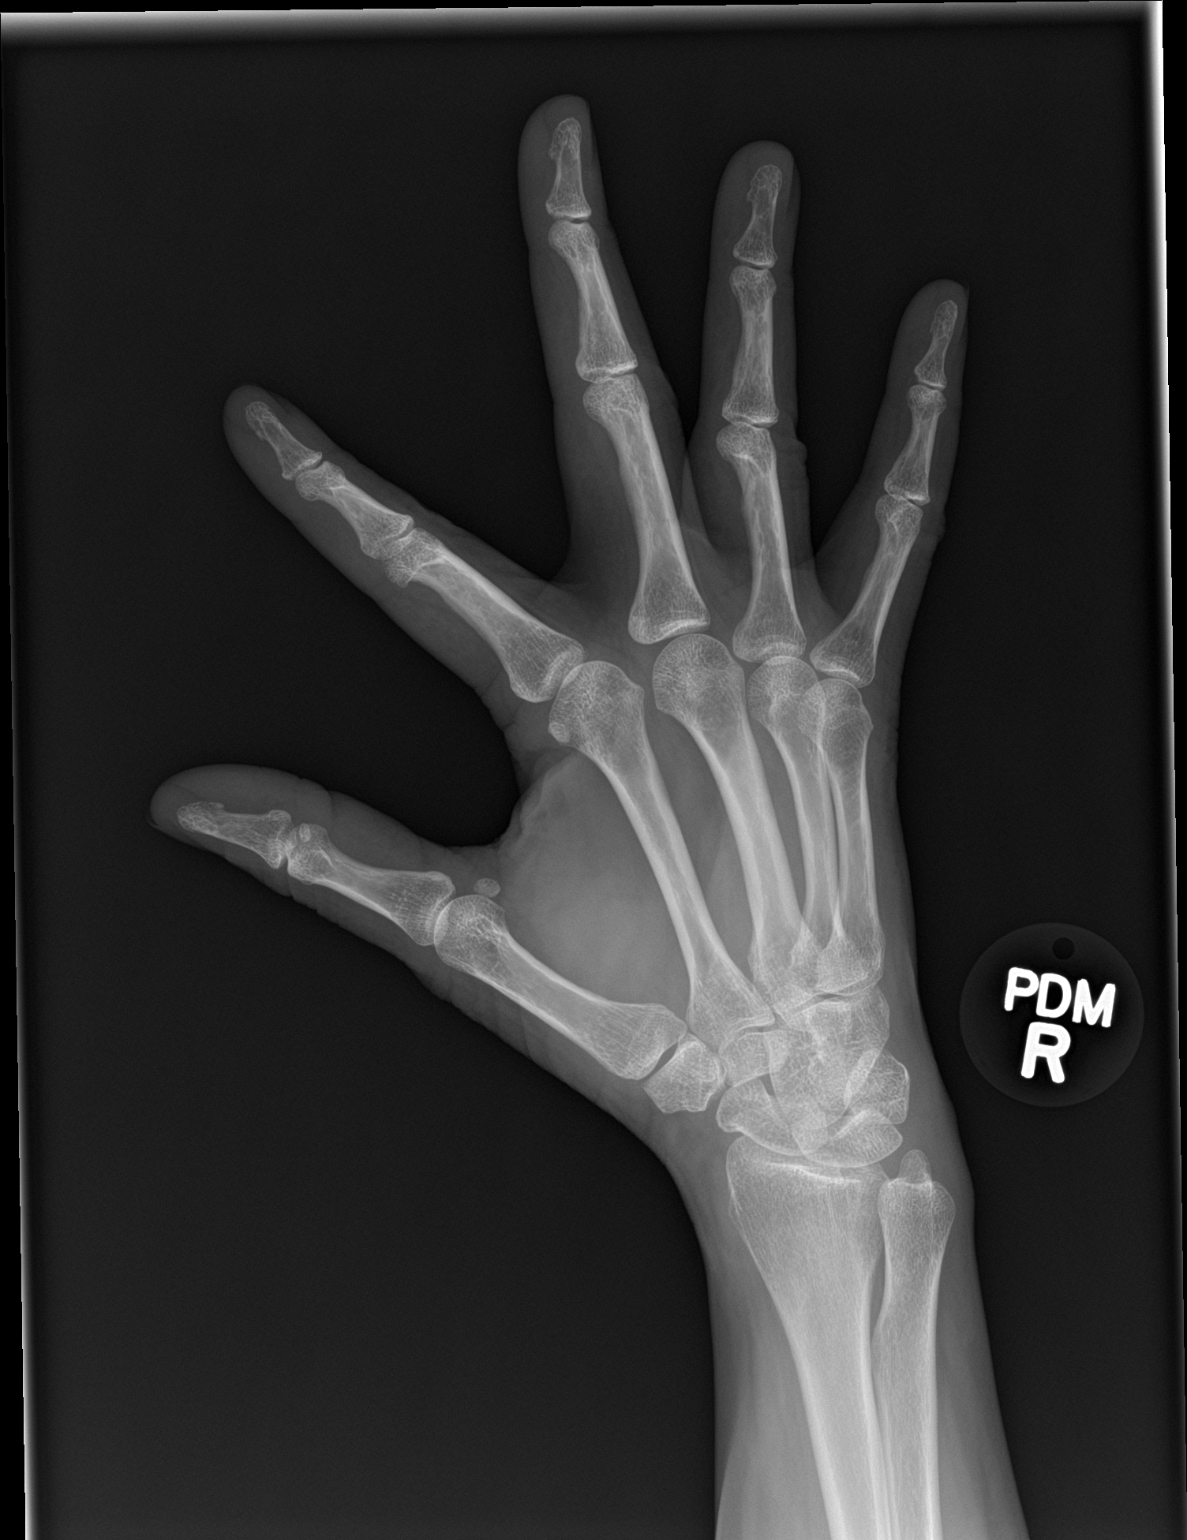

[hand lat]
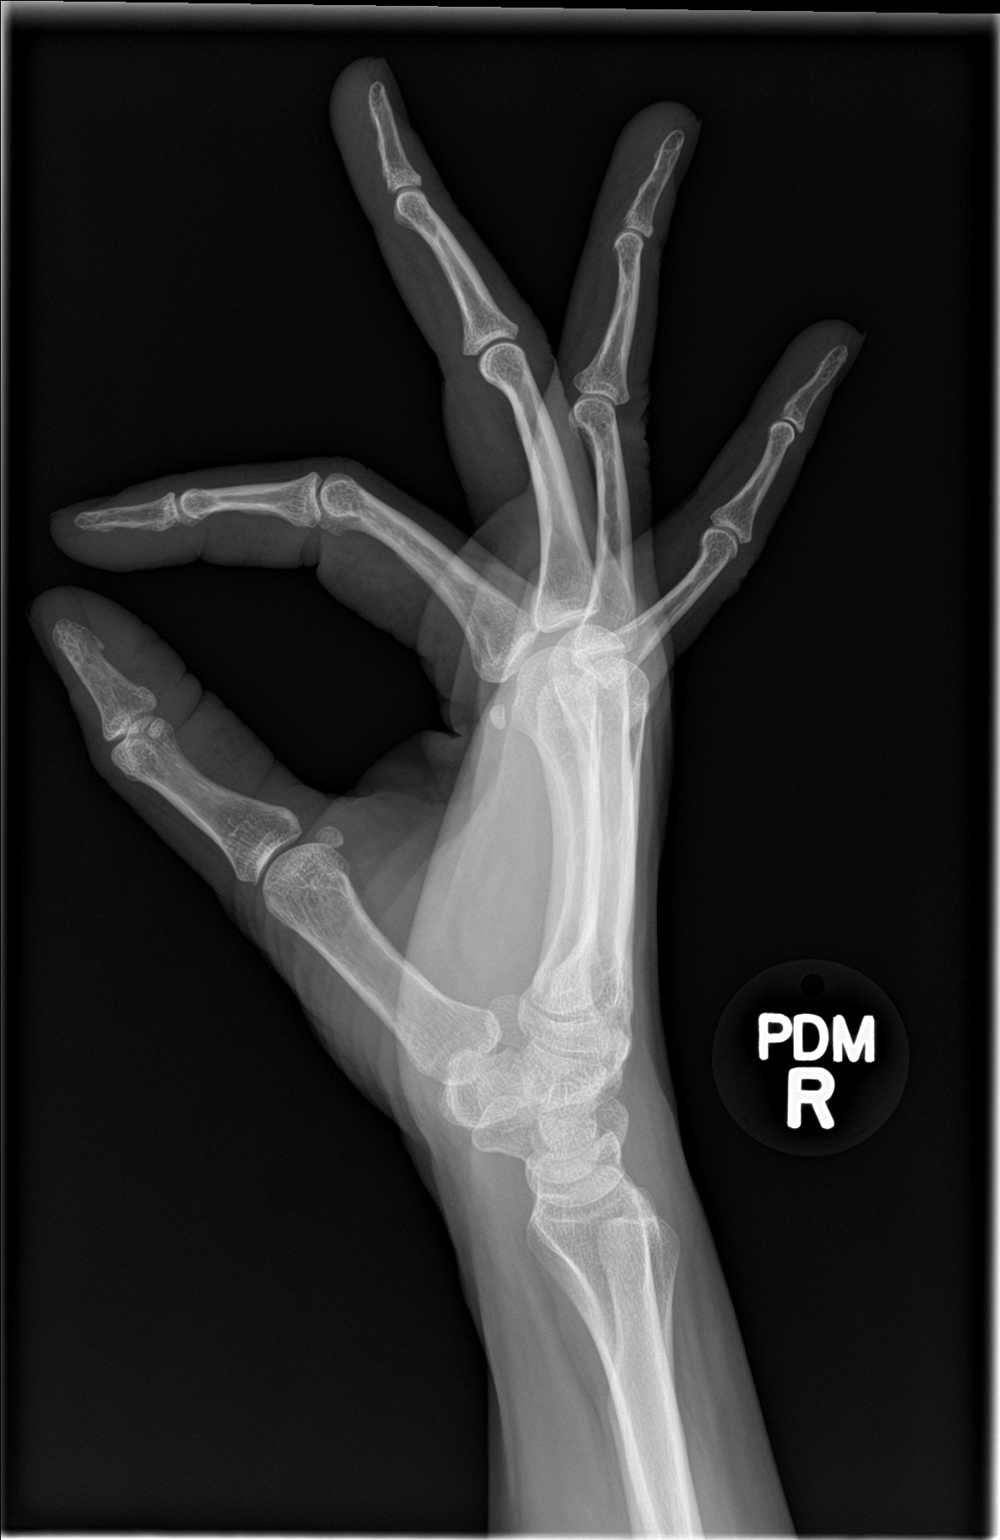

[3 of 3 positions shown; findings below may reference images not displayed]

FINDINGS: There is no evidence of fracture or dislocation. There is no
evidence of arthropathy or other focal bone abnormality. Soft
tissues are unremarkable.
IMPRESSION: Negative.

## 2016-10-01 DIAGNOSIS — M25552 Pain in left hip: Secondary | ICD-10-CM | POA: Diagnosis not present

## 2016-10-01 DIAGNOSIS — M25562 Pain in left knee: Secondary | ICD-10-CM | POA: Diagnosis not present

## 2016-10-01 DIAGNOSIS — M25551 Pain in right hip: Secondary | ICD-10-CM | POA: Diagnosis not present

## 2016-10-16 DIAGNOSIS — M25562 Pain in left knee: Secondary | ICD-10-CM | POA: Diagnosis not present

## 2016-10-16 DIAGNOSIS — M25552 Pain in left hip: Secondary | ICD-10-CM | POA: Diagnosis not present

## 2016-10-16 DIAGNOSIS — M25551 Pain in right hip: Secondary | ICD-10-CM | POA: Diagnosis not present

## 2016-10-18 IMAGING — MR MR HEAD WO/W CM
12 series · 48 of 48 positions shown · IV contrast (multihance)
Comparison: CT head 10/05/2012

CLINICAL DATA: Vertigo.  Speech abnormality

EXAM:
MRI HEAD WITHOUT AND WITH CONTRAST
TECHNIQUE: Multiplanar, multiecho pulse sequences of the brain and surrounding
structures were obtained without and with intravenous contrast.
CONTRAST:  13mL MULTIHANCE GADOBENATE DIMEGLUMINE 529 MG/ML IV SOLN

[Series 5: T1 · sagittal · 4.0mm · 0.75mm/px · 2 of 31 slices shown (1 of 3)]
[im 1/31]
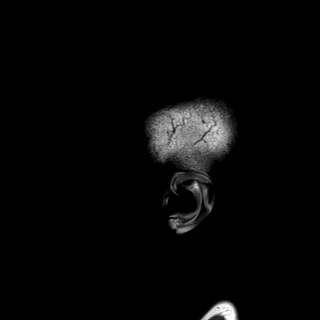
[im 31/31]
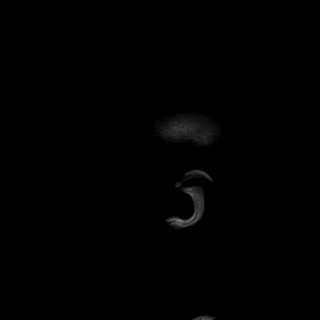

[Series 6: DWI · axial · 3.0mm · 1.44mm/px · z∈[-73,+61]mm · 5 of 84 slices shown (1 of 4)]
[im 1/84]
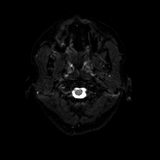
[im 21/84]
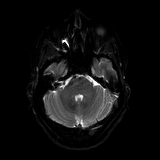
[im 42/84]
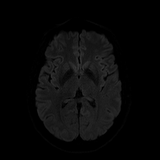
[im 63/84]
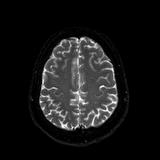
[im 84/84]
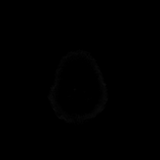

[Series 7: DWI · axial · 3.0mm · 1.44mm/px · z∈[-73,+61]mm · 3 of 42 slices shown (2 of 4)]
[im 1/42]
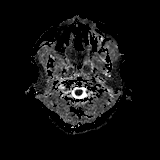
[im 21/42]
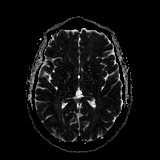
[im 42/42]
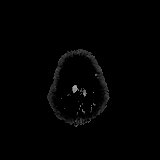

[Series 8: DWI · coronal · 5.0mm · 1.44mm/px · 4 of 60 slices shown (3 of 4)]
[im 1/60]
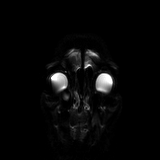
[im 20/60]
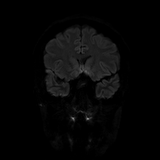
[im 40/60]
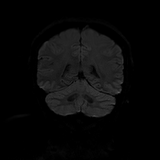
[im 60/60]
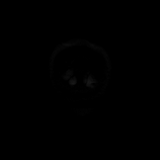

[Series 9: DWI · coronal · 5.0mm · 1.44mm/px · 2 of 30 slices shown (4 of 4)]
[im 1/30]
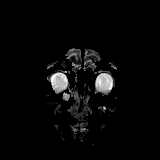
[im 30/30]
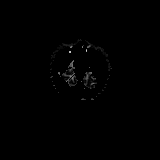

[Series 10: T2 · axial · 4.0mm · 0.36mm/px · z∈[-73,+61]mm · 2 of 27 slices shown]
[im 1/27]
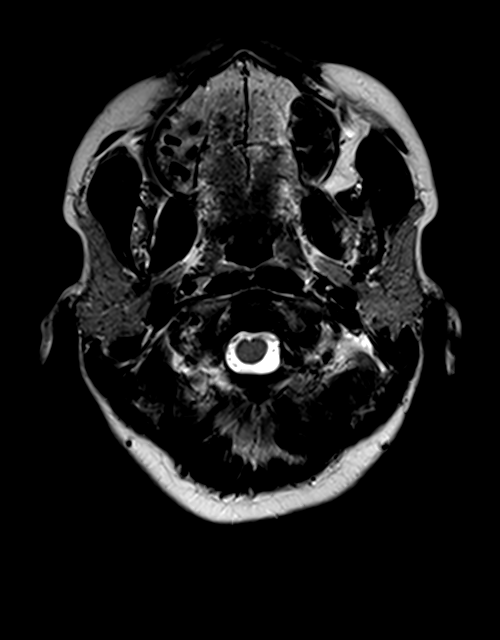
[im 27/27]
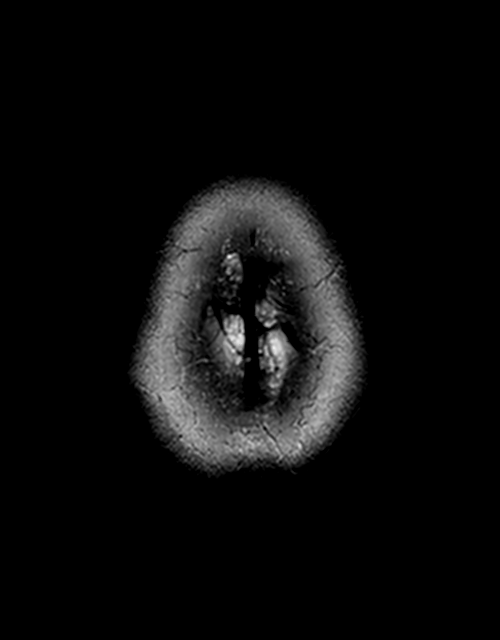

[Series 11: FLAIR · axial · 4.0mm · 0.72mm/px · z∈[-75,+59]mm · 2 of 27 slices shown]
[im 1/27]
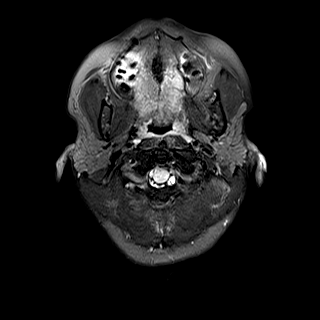
[im 27/27]
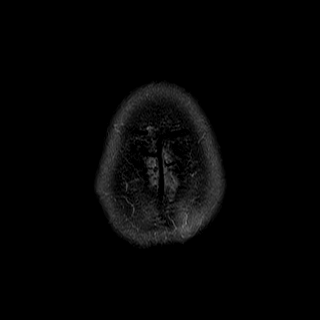

[Series 13: swi_images · axial · 1.5mm · 0.90mm/px · z∈[-76,+65]mm · 6 of 96 slices shown]
[im 1/96]
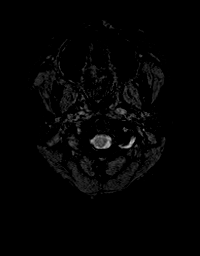
[im 20/96]
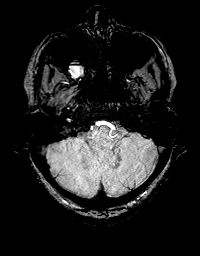
[im 39/96]
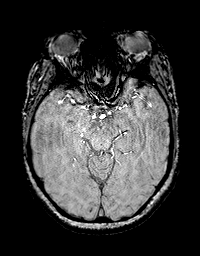
[im 58/96]
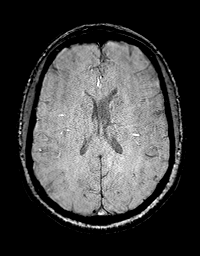
[im 77/96]
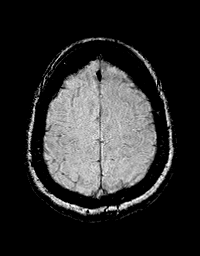
[im 96/96]
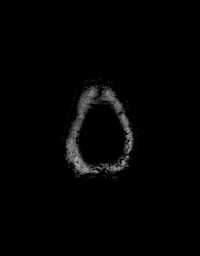

[Series 14: T1 · axial · 1.0mm · 0.90mm/px · z∈[-76,+65]mm · 9 of 144 slices shown (2 of 3)]
[im 1/144]
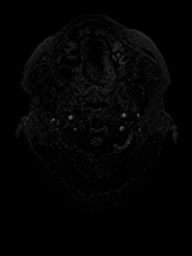
[im 18/144]
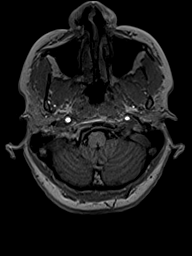
[im 36/144]
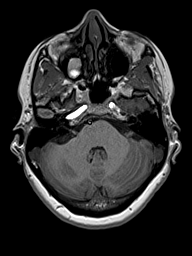
[im 54/144]
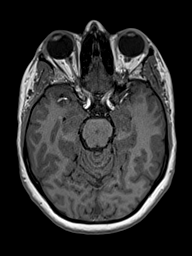
[im 72/144]
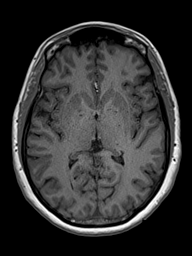
[im 90/144]
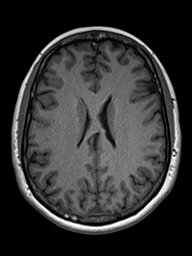
[im 108/144]
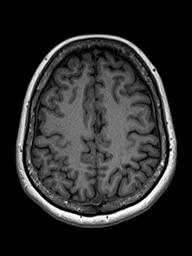
[im 126/144]
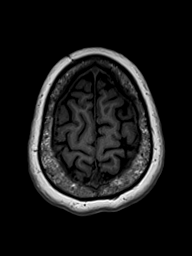
[im 144/144]
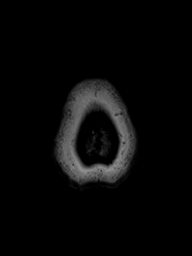

[Series 15: T2 post-contrast · coronal · 4.5mm · 0.36mm/px · 2 of 30 slices shown]
[im 1/30]
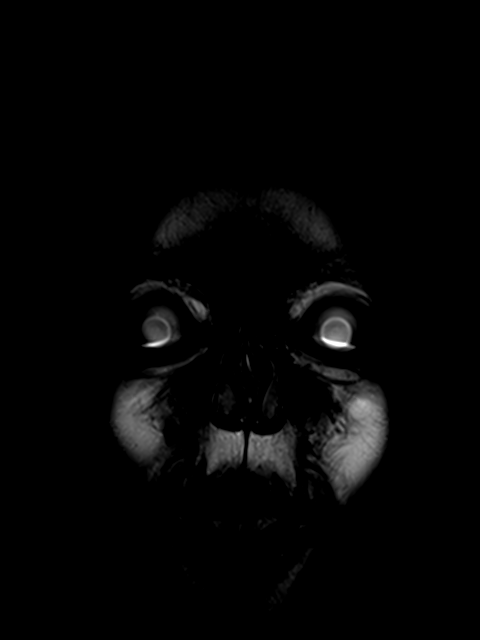
[im 30/30]
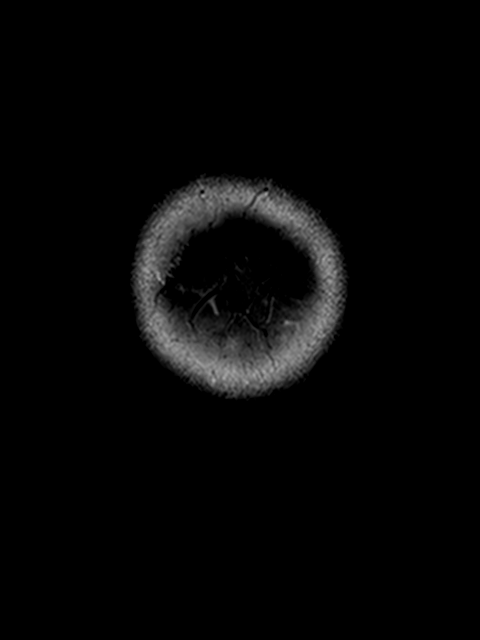

[Series 16: T1 · axial · 1.0mm · 0.90mm/px · z∈[-76,+65]mm · 9 of 144 slices shown (3 of 3)]
[im 1/144]
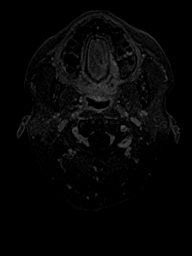
[im 18/144]
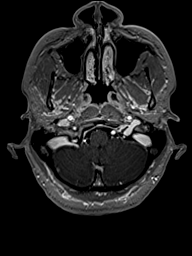
[im 36/144]
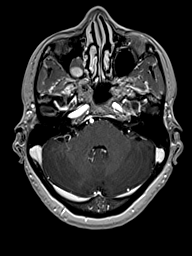
[im 54/144]
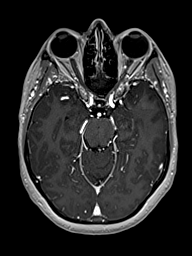
[im 72/144]
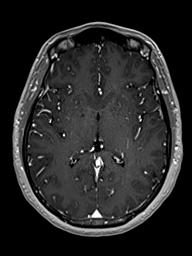
[im 90/144]
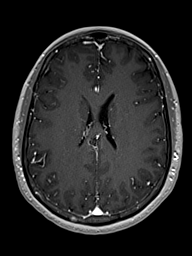
[im 108/144]
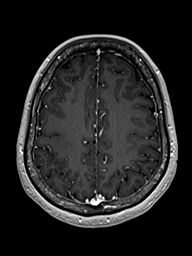
[im 126/144]
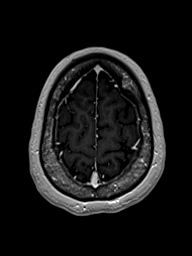
[im 144/144]
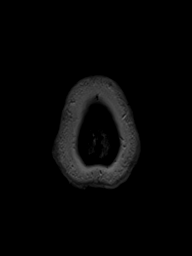

[Series 17: T1 post-contrast · coronal · 4.5mm · 0.72mm/px · 2 of 30 slices shown]
[im 1/30]
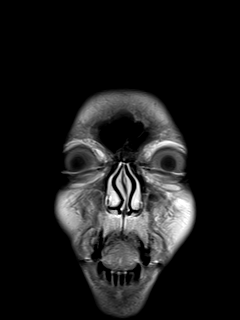
[im 30/30]
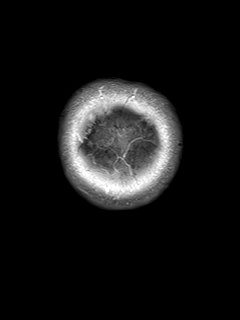

[48 of 48 positions shown; findings below may reference images not displayed]

FINDINGS: Ventricle size normal. Mild asymmetry of the vast ventricles left
greater than right. Cerebral volume normal.

Negative for acute infarct. Scattered small subcortical white matter
hyperintensities bilaterally. Basal ganglia and brainstem normal.

Negative for intracranial hemorrhage.  No fluid collection.

Negative for mass or edema.  No shift of the midline structures.

Normal enhancement. No enhancing mass lesion. Dural venous sinus
enhancement is normal. Leptomeningeal enhancement is normal.

Pituitary normal in size. Cerebellar tonsils above the foramen
magnum.

Mucosal edema in the paranasal sinuses with retention cyst on the
right. Normal orbit. Circle of Willis patent.
IMPRESSION: No acute abnormality. Small subcortical white matter
hyperintensities may be related to chronic ischemia or migraine
headache.

## 2016-10-22 DIAGNOSIS — M25552 Pain in left hip: Secondary | ICD-10-CM | POA: Diagnosis not present

## 2016-10-22 DIAGNOSIS — M25551 Pain in right hip: Secondary | ICD-10-CM | POA: Diagnosis not present

## 2016-10-22 DIAGNOSIS — M25562 Pain in left knee: Secondary | ICD-10-CM | POA: Diagnosis not present

## 2016-10-24 DIAGNOSIS — M25562 Pain in left knee: Secondary | ICD-10-CM | POA: Diagnosis not present

## 2016-10-24 DIAGNOSIS — M25552 Pain in left hip: Secondary | ICD-10-CM | POA: Diagnosis not present

## 2016-10-24 DIAGNOSIS — M25551 Pain in right hip: Secondary | ICD-10-CM | POA: Diagnosis not present

## 2016-10-27 DIAGNOSIS — M25562 Pain in left knee: Secondary | ICD-10-CM | POA: Diagnosis not present

## 2016-10-27 DIAGNOSIS — M25551 Pain in right hip: Secondary | ICD-10-CM | POA: Diagnosis not present

## 2016-10-27 DIAGNOSIS — M25552 Pain in left hip: Secondary | ICD-10-CM | POA: Diagnosis not present

## 2016-10-29 DIAGNOSIS — M25551 Pain in right hip: Secondary | ICD-10-CM | POA: Diagnosis not present

## 2016-10-29 DIAGNOSIS — M25552 Pain in left hip: Secondary | ICD-10-CM | POA: Diagnosis not present

## 2016-10-29 DIAGNOSIS — M25562 Pain in left knee: Secondary | ICD-10-CM | POA: Diagnosis not present

## 2016-11-05 DIAGNOSIS — M25551 Pain in right hip: Secondary | ICD-10-CM | POA: Diagnosis not present

## 2016-11-05 DIAGNOSIS — M25562 Pain in left knee: Secondary | ICD-10-CM | POA: Diagnosis not present

## 2016-11-05 DIAGNOSIS — M25552 Pain in left hip: Secondary | ICD-10-CM | POA: Diagnosis not present

## 2016-11-07 DIAGNOSIS — M25552 Pain in left hip: Secondary | ICD-10-CM | POA: Diagnosis not present

## 2016-11-07 DIAGNOSIS — M25551 Pain in right hip: Secondary | ICD-10-CM | POA: Diagnosis not present

## 2016-11-07 DIAGNOSIS — M25562 Pain in left knee: Secondary | ICD-10-CM | POA: Diagnosis not present

## 2016-11-13 DIAGNOSIS — M25551 Pain in right hip: Secondary | ICD-10-CM | POA: Diagnosis not present

## 2016-11-13 DIAGNOSIS — M25552 Pain in left hip: Secondary | ICD-10-CM | POA: Diagnosis not present

## 2016-11-13 DIAGNOSIS — M25562 Pain in left knee: Secondary | ICD-10-CM | POA: Diagnosis not present

## 2016-11-19 DIAGNOSIS — M25562 Pain in left knee: Secondary | ICD-10-CM | POA: Diagnosis not present

## 2016-11-19 DIAGNOSIS — M25552 Pain in left hip: Secondary | ICD-10-CM | POA: Diagnosis not present

## 2016-11-19 DIAGNOSIS — M25551 Pain in right hip: Secondary | ICD-10-CM | POA: Diagnosis not present

## 2016-12-17 DIAGNOSIS — M79601 Pain in right arm: Secondary | ICD-10-CM | POA: Diagnosis not present

## 2016-12-17 DIAGNOSIS — J029 Acute pharyngitis, unspecified: Secondary | ICD-10-CM | POA: Diagnosis not present

## 2017-01-13 ENCOUNTER — Other Ambulatory Visit: Payer: Self-pay | Admitting: Nurse Practitioner

## 2017-01-13 DIAGNOSIS — N644 Mastodynia: Secondary | ICD-10-CM

## 2017-01-14 ENCOUNTER — Ambulatory Visit
Admission: RE | Admit: 2017-01-14 | Discharge: 2017-01-14 | Disposition: A | Discharge status: Home / Self Care | Payer: 59 | Source: Ambulatory Visit | Attending: Nurse Practitioner | Admitting: Nurse Practitioner

## 2017-01-14 DIAGNOSIS — N644 Mastodynia: Secondary | ICD-10-CM

## 2017-01-14 DIAGNOSIS — R922 Inconclusive mammogram: Secondary | ICD-10-CM | POA: Diagnosis not present

## 2017-04-09 ENCOUNTER — Encounter: Payer: Self-pay | Admitting: Family Medicine

## 2017-05-04 DIAGNOSIS — H8110 Benign paroxysmal vertigo, unspecified ear: Secondary | ICD-10-CM | POA: Diagnosis not present

## 2017-06-09 DIAGNOSIS — Z01419 Encounter for gynecological examination (general) (routine) without abnormal findings: Secondary | ICD-10-CM | POA: Diagnosis not present

## 2017-06-23 DIAGNOSIS — Z13220 Encounter for screening for lipoid disorders: Secondary | ICD-10-CM | POA: Diagnosis not present

## 2017-06-23 DIAGNOSIS — H8110 Benign paroxysmal vertigo, unspecified ear: Secondary | ICD-10-CM | POA: Diagnosis not present

## 2017-06-23 DIAGNOSIS — Z0000 Encounter for general adult medical examination without abnormal findings: Secondary | ICD-10-CM | POA: Diagnosis not present

## 2017-06-23 DIAGNOSIS — M5432 Sciatica, left side: Secondary | ICD-10-CM | POA: Diagnosis not present

## 2017-06-23 DIAGNOSIS — D509 Iron deficiency anemia, unspecified: Secondary | ICD-10-CM | POA: Diagnosis not present

## 2017-07-15 ENCOUNTER — Emergency Department (HOSPITAL_BASED_OUTPATIENT_CLINIC_OR_DEPARTMENT_OTHER)
Admission: EM | Admit: 2017-07-15 | Discharge: 2017-07-15 | Disposition: A | Discharge status: Home / Self Care | Payer: 59 | Attending: Emergency Medicine | Admitting: Emergency Medicine

## 2017-07-15 ENCOUNTER — Other Ambulatory Visit: Payer: Self-pay

## 2017-07-15 ENCOUNTER — Emergency Department (HOSPITAL_BASED_OUTPATIENT_CLINIC_OR_DEPARTMENT_OTHER): Payer: 59

## 2017-07-15 ENCOUNTER — Encounter (HOSPITAL_BASED_OUTPATIENT_CLINIC_OR_DEPARTMENT_OTHER): Payer: Self-pay

## 2017-07-15 DIAGNOSIS — R079 Chest pain, unspecified: Secondary | ICD-10-CM | POA: Insufficient documentation

## 2017-07-15 DIAGNOSIS — D573 Sickle-cell trait: Secondary | ICD-10-CM | POA: Diagnosis not present

## 2017-07-15 DIAGNOSIS — R0789 Other chest pain: Secondary | ICD-10-CM | POA: Diagnosis not present

## 2017-07-15 DIAGNOSIS — Z9049 Acquired absence of other specified parts of digestive tract: Secondary | ICD-10-CM | POA: Diagnosis not present

## 2017-07-15 DIAGNOSIS — Z79899 Other long term (current) drug therapy: Secondary | ICD-10-CM | POA: Insufficient documentation

## 2017-07-15 DIAGNOSIS — R0602 Shortness of breath: Secondary | ICD-10-CM | POA: Diagnosis not present

## 2017-07-15 LAB — CBC
HCT: 30.4 % — ABNORMAL LOW (ref 36.0–46.0)
Hemoglobin: 11 g/dL — ABNORMAL LOW (ref 12.0–15.0)
MCH: 26 pg (ref 26.0–34.0)
MCHC: 36.2 g/dL — ABNORMAL HIGH (ref 30.0–36.0)
MCV: 71.9 fL — ABNORMAL LOW (ref 78.0–100.0)
Platelets: 153 10*3/uL (ref 150–400)
RBC: 4.23 MIL/uL (ref 3.87–5.11)
RDW: 14.3 % (ref 11.5–15.5)
WBC: 7.1 10*3/uL (ref 4.0–10.5)

## 2017-07-15 LAB — BASIC METABOLIC PANEL
Anion gap: 8 (ref 5–15)
BUN: 14 mg/dL (ref 6–20)
CO2: 25 mmol/L (ref 22–32)
Calcium: 9 mg/dL (ref 8.9–10.3)
Chloride: 108 mmol/L (ref 101–111)
Creatinine, Ser: 0.66 mg/dL (ref 0.44–1.00)
GFR calc Af Amer: >60 mL/min (ref >60)
GFR calc non Af Amer: >60 mL/min (ref >60)
Glucose, Bld: 137 mg/dL — ABNORMAL HIGH (ref 65–99)
Potassium: 4 mmol/L (ref 3.5–5.1)
Sodium: 141 mmol/L (ref 135–145)

## 2017-07-15 LAB — TROPONIN I: Troponin I: <0.03 ng/mL (ref <0.03)

## 2017-07-15 NOTE — ED Triage Notes (Signed)
C/o CP x 1 hour-NAD-steady gait 

## 2017-07-15 NOTE — Discharge Instructions (Signed)
Please read and follow all provided instructions.  Your diagnoses today include:  1. Chest pain, unspecified type     Tests performed today include: An EKG of your heart A chest x-ray Cardiac enzymes - a blood test for heart muscle damage Blood counts and electrolytes Vital signs. See below for your results today.   Medications prescribed:   Take any prescribed medications only as directed.  Follow-up instructions: Please follow-up with your primary care provider as soon as you can for further evaluation of your symptoms.   Return instructions:  SEEK IMMEDIATE MEDICAL ATTENTION IF: You have severe chest pain, especially if the pain is crushing or pressure-like and spreads to the arms, back, neck, or jaw, or if you have sweating, nausea (feeling sick to your stomach), or shortness of breath. THIS IS AN EMERGENCY. Don't wait to see if the pain will go away. Get medical help at once. Call 911 or [phone] (operator). DO NOT drive yourself to the hospital.  Your chest pain gets worse and does not go away with rest.  You have an attack of chest pain lasting longer than usual, despite rest and treatment with the medications your caregiver has prescribed.  You wake from sleep with chest pain or shortness of breath. You feel dizzy or faint. You have chest pain not typical of your usual pain for which you originally saw your caregiver.  You have any other emergent concerns regarding your health.  Additional Information: Chest pain comes from many different causes. Your caregiver has diagnosed you as having chest pain that is not specific for one problem, but does not require admission.  You are at low risk for an acute heart condition or other serious illness.   Your vital signs today were: BP 123/82    Pulse 77    Temp 97.8 F (36.6 C) (Oral)    Resp 15    Ht 5\' 4"  (1.626 m)    Wt 70.3 kg (155 lb)    SpO2 100%    BMI 26.61 kg/m  If your blood pressure (BP) was elevated above 135/85 this visit,  please have this repeated by your doctor within one month. --------------

## 2017-07-15 NOTE — ED Provider Notes (Signed)
[facility] EMERGENCY DEPARTMENT Provider Note   CSN: [id number] Arrival date & time: 07/15/17  1548     History   Chief Complaint Chief Complaint  Patient presents with  . Chest Pain    HPI Deanna Watkins is a 56 y.o. female.  HPI  56 y.o. female, presents to the Emergency Department today due to chest pain around 1600. Pt states the pain occurred at rest. States pain lasted 40-50 minutes and was a dull pressure sensation that she rates 6/10. No meds taken at that time. Ended spontaneously. No N/V. No SOB. No diaphoresis. Some numbness to right shoulder. Denies pain currently. No ABD pain. No fevers. No cough/congestion. No hx ACS. No FH. No recent travel. No recent surgeries. Denies symptoms currently. No other symptoms noted   Past Medical History:  Diagnosis Date  . ANEMIA-NOS 04/22/2007  . GERD 04/22/2007  . Headache(784.0) 07/13/2007  . Menopause   . Sickle cell trait (HCC)   . Sickle-cell trait (HCC) 04/22/2007    Patient Active Problem List   Diagnosis Date Noted  . Other acquired hemolytic anemias (HCC) 04/05/2015  . Tinnitus of right ear 10/04/2010  . Allergic rhinitis 07/13/2007  . HEADACHE 07/13/2007  . SICKLE-CELL TRAIT 04/22/2007    Past Surgical History:  Procedure Laterality Date  . ABDOMINAL HYSTERECTOMY  1999  . CHOLECYSTECTOMY    . OOPHORECTOMY  2006    OB History    No data available       Home Medications    Prior to Admission medications   Medication Sig Start Date End Date Taking? Authorizing Provider  cetirizine (ZYRTEC) 10 MG tablet Take 10 mg by mouth daily as needed for allergies.    [provider]  Cholecalciferol (VITAMIN D3) 2000 units TABS Take 1 tablet by mouth daily.    [provider]  cyclobenzaprine (FLEXERIL) 10 MG tablet Take 1 tablet (10 mg total) by mouth 3 (three) times daily as needed for muscle spasms. 08/17/15   Nelwyn SalisburyFry, Stephen A, MD  Elastic Bandages & Supports (WRIST SPLINT/COCK-UP/LEFT  M) MISC 1 Device by Does not apply route daily. DX: carpal tunnel syndrome 03/21/16   Tat, Octaviano Battyebecca S, DO  Elastic Bandages & Supports (WRIST SPLINT/COCK-UP/RIGHT M) MISC 1 Device by Does not apply route daily. DX: carpal tunnel syndrome 03/21/16   Tat, Octaviano Battyebecca S, DO  fluticasone (FLONASE) 50 MCG/ACT nasal spray Place 2 sprays into both nostrils daily. 10/27/14   Terressa KoyanagiKim, Hannah R, DO  Folic Acid-Vit B6-Vit B12 (FOLBEE) 2.5-25-1 MG TABS tablet Take 1 tablet by mouth daily. 04/04/14   Eulis FosterWebb, Padonda B, FNP  ibuprofen (ADVIL,MOTRIN) 200 MG tablet Take 400-600 mg by mouth every 6 (six) hours as needed for headache or moderate pain.    [provider]  meclizine (ANTIVERT) 25 MG tablet Take 1 tablet (25 mg total) by mouth 3 (three) times daily as needed for dizziness. Patient not taking: Reported on 03/21/2016 02/21/16   Terressa KoyanagiKim, Hannah R, DO  Multiple Vitamin (MULTIVITAMIN WITH MINERALS) TABS tablet Take 1 tablet by mouth daily.    [provider]  vitamin B-12 (CYANOCOBALAMIN) 500 MCG tablet Take 500 mcg by mouth daily.    [provider]    Family History Family History  Problem Relation Age of Onset  . Colon cancer Mother   . Breast cancer Sister   . Fibromyalgia Sister     Social History Social History   Tobacco Use  . Smoking status: Never Smoker  .  Smokeless tobacco: Never Used  Substance Use Topics  . Alcohol use: No    Alcohol/week: 0.0 oz  . Drug use: No     Allergies   Patient has no known allergies.   Review of Systems Review of Systems ROS reviewed and all are negative for acute change except as noted in the HPI.  Physical Exam Updated Vital Signs BP 123/82   Pulse 77   Temp 97.8 F (36.6 C) (Oral)   Resp 15   Ht 5\' 4"  (1.626 m)   Wt 70.3 kg (155 lb)   SpO2 100%   BMI 26.61 kg/m   Physical Exam  Constitutional: She is oriented to person, place, and time. She appears well-developed and well-nourished. No distress.  HENT:  Head: Normocephalic and  atraumatic.  Right Ear: Tympanic membrane, external ear and ear canal normal.  Left Ear: Tympanic membrane, external ear and ear canal normal.  Nose: Nose normal.  Mouth/Throat: Uvula is midline, oropharynx is clear and moist and mucous membranes are normal. No trismus in the jaw. No oropharyngeal exudate, posterior oropharyngeal erythema or tonsillar abscesses.  Eyes: EOM are normal. Pupils are equal, round, and reactive to light.  Neck: Normal range of motion. Neck supple. No tracheal deviation present.  Cardiovascular: Normal rate, regular rhythm, S1 normal, S2 normal, normal heart sounds, intact distal pulses and normal pulses.  Pulmonary/Chest: Effort normal and breath sounds normal. No respiratory distress. She has no decreased breath sounds. She has no wheezes. She has no rhonchi. She has no rales.  TTP across anterior chest wall without palpable or visible deformities   Abdominal: Normal appearance and bowel sounds are normal. There is no tenderness.  Musculoskeletal: Normal range of motion.  Neurological: She is alert and oriented to person, place, and time.  Skin: Skin is warm and dry.  Psychiatric: She has a normal mood and affect. Her speech is normal and behavior is normal. Thought content normal.  Nursing note and vitals reviewed.    ED Treatments / Results  Labs (all labs ordered are listed, but only abnormal results are displayed) Labs Reviewed  BASIC METABOLIC PANEL - Abnormal; Notable for the following components:      Result Value   Glucose, Bld 137 (*)    All other components within normal limits  CBC - Abnormal; Notable for the following components:   Hemoglobin 11.0 (*)    HCT 30.4 (*)    MCV 71.9 (*)    MCHC 36.2 (*)    All other components within normal limits  TROPONIN I    EKG  EKG Interpretation None       Radiology Dg Chest 2 View  Result Date: 07/15/2017 CLINICAL DATA:  Overlies chest pain radiating to the back and right arm which began  today. There is some associated shortness of breath. The patient porch previous similar episodes most recently 6 months ago which was not as severe. Patient has history of sickle cell trait and is a nonsmoker. EXAM: CHEST  2 VIEW COMPARISON:  Chest x-ray of January 29, 2015 FINDINGS: The lungs are well-expanded and clear. The heart and pulmonary vascularity are normal. There is no pleural effusion, pneumothorax, or pneumomediastinum. The bony thorax is unremarkable. IMPRESSION: There is no active cardiopulmonary disease. Electronically Signed   By: David  SwazilandJordan M.D.   On: 07/15/2017 16:22    Procedures Procedures (including critical care time)  Medications Ordered in ED Medications - No data to display   Initial Impression / Assessment and  Plan / ED Course  I have reviewed the triage vital signs and the nursing notes.  Pertinent labs & imaging results that were available during my care of the patient were reviewed by me and considered in my medical decision making (see chart for details).  Final Clinical Impressions(s) / ED Diagnoses  {I have reviewed and evaluated the relevant laboratory values. {I have reviewed and evaluated the relevant imaging studies. {I have interpreted the relevant EKG. {I have reviewed the relevant previous healthcare records.  {I obtained HPI from historian.   ED Course:  Assessment: Pt is a 56 y.o. female presents to the Emergency Department today due to chest pain around 1600. Pt states the pain occurred at rest. States pain lasted 40-50 minutes and was a dull pressure sensation that she rates 6/10. No meds taken at that time. Ended spontaneously. No N/V. No SOB. No diaphoresis. Some numbness to right shoulder. Denies pain currently. No ABD pain. No fevers. No cough/congestion. No hx ACS. No FH. No recent travel. No recent surgeries. Denies symptoms currently. Patient is to be discharged with recommendation to follow up with PCP in regards to today's hospital visit.  Chest pain is not likely of cardiac or pulmonary etiology d/t presentation, VSS, no tracheal deviation, no JVD or new murmur, RRR, breath sounds equal bilaterally, EKG without acute abnormalities, negative troponin, and negative CXR. Heart Score 1. Pt has been advised start NSAIDs and return to the ED is CP becomes exertional, associated with diaphoresis or nausea, radiates to left jaw/arm, worsens or becomes concerning in any way. Likely musculoskeletal. Pt appears reliable for follow up and is agreeable to discharge. Patient is in no acute distress. Vital Signs are stable. Patient is able to ambulate. Patient able to tolerate PO.  Disposition/Plan:  DC home Additional Verbal discharge instructions given and discussed with patient.  Pt Instructed to f/u with PCP in the next week for evaluation and treatment of symptoms. Return precautions given Pt acknowledges and agrees with plan  Supervising Physician Arby Barrette, MD  Final diagnoses:  Chest pain, unspecified type    ED Discharge Orders    None       Audry Pili, PA-C 07/15/17 1924    Arby Barrette, MD 07/16/17 [phone]

## 2017-09-09 DIAGNOSIS — R0789 Other chest pain: Secondary | ICD-10-CM | POA: Diagnosis not present

## 2017-10-10 DIAGNOSIS — M9903 Segmental and somatic dysfunction of lumbar region: Secondary | ICD-10-CM | POA: Diagnosis not present

## 2017-10-10 DIAGNOSIS — M5417 Radiculopathy, lumbosacral region: Secondary | ICD-10-CM | POA: Diagnosis not present

## 2017-10-10 DIAGNOSIS — M5136 Other intervertebral disc degeneration, lumbar region: Secondary | ICD-10-CM | POA: Diagnosis not present

## 2017-10-12 DIAGNOSIS — M9903 Segmental and somatic dysfunction of lumbar region: Secondary | ICD-10-CM | POA: Diagnosis not present

## 2017-10-12 DIAGNOSIS — M5417 Radiculopathy, lumbosacral region: Secondary | ICD-10-CM | POA: Diagnosis not present

## 2017-10-12 DIAGNOSIS — M5136 Other intervertebral disc degeneration, lumbar region: Secondary | ICD-10-CM | POA: Diagnosis not present

## 2017-10-14 DIAGNOSIS — M5417 Radiculopathy, lumbosacral region: Secondary | ICD-10-CM | POA: Diagnosis not present

## 2017-10-14 DIAGNOSIS — M5136 Other intervertebral disc degeneration, lumbar region: Secondary | ICD-10-CM | POA: Diagnosis not present

## 2017-10-14 DIAGNOSIS — M9903 Segmental and somatic dysfunction of lumbar region: Secondary | ICD-10-CM | POA: Diagnosis not present

## 2017-10-14 DIAGNOSIS — Z79899 Other long term (current) drug therapy: Secondary | ICD-10-CM | POA: Diagnosis not present

## 2017-10-15 DIAGNOSIS — M9903 Segmental and somatic dysfunction of lumbar region: Secondary | ICD-10-CM | POA: Diagnosis not present

## 2017-10-15 DIAGNOSIS — M5136 Other intervertebral disc degeneration, lumbar region: Secondary | ICD-10-CM | POA: Diagnosis not present

## 2017-10-15 DIAGNOSIS — M5417 Radiculopathy, lumbosacral region: Secondary | ICD-10-CM | POA: Diagnosis not present

## 2017-10-19 DIAGNOSIS — M9903 Segmental and somatic dysfunction of lumbar region: Secondary | ICD-10-CM | POA: Diagnosis not present

## 2017-10-19 DIAGNOSIS — M5417 Radiculopathy, lumbosacral region: Secondary | ICD-10-CM | POA: Diagnosis not present

## 2017-10-19 DIAGNOSIS — M5136 Other intervertebral disc degeneration, lumbar region: Secondary | ICD-10-CM | POA: Diagnosis not present

## 2017-10-20 DIAGNOSIS — Z1211 Encounter for screening for malignant neoplasm of colon: Secondary | ICD-10-CM | POA: Diagnosis not present

## 2017-10-21 DIAGNOSIS — M5417 Radiculopathy, lumbosacral region: Secondary | ICD-10-CM | POA: Diagnosis not present

## 2017-10-21 DIAGNOSIS — M9903 Segmental and somatic dysfunction of lumbar region: Secondary | ICD-10-CM | POA: Diagnosis not present

## 2017-10-21 DIAGNOSIS — M5136 Other intervertebral disc degeneration, lumbar region: Secondary | ICD-10-CM | POA: Diagnosis not present

## 2017-10-22 DIAGNOSIS — M9903 Segmental and somatic dysfunction of lumbar region: Secondary | ICD-10-CM | POA: Diagnosis not present

## 2017-10-22 DIAGNOSIS — M5417 Radiculopathy, lumbosacral region: Secondary | ICD-10-CM | POA: Diagnosis not present

## 2017-10-22 DIAGNOSIS — M5136 Other intervertebral disc degeneration, lumbar region: Secondary | ICD-10-CM | POA: Diagnosis not present

## 2017-10-26 DIAGNOSIS — M9903 Segmental and somatic dysfunction of lumbar region: Secondary | ICD-10-CM | POA: Diagnosis not present

## 2017-10-26 DIAGNOSIS — M5136 Other intervertebral disc degeneration, lumbar region: Secondary | ICD-10-CM | POA: Diagnosis not present

## 2017-10-26 DIAGNOSIS — M5417 Radiculopathy, lumbosacral region: Secondary | ICD-10-CM | POA: Diagnosis not present

## 2017-10-27 ENCOUNTER — Other Ambulatory Visit: Payer: Self-pay | Admitting: Family Medicine

## 2017-10-27 ENCOUNTER — Ambulatory Visit
Admission: RE | Admit: 2017-10-27 | Discharge: 2017-10-27 | Disposition: A | Discharge status: Home / Self Care | Payer: 59 | Source: Ambulatory Visit | Attending: Family Medicine | Admitting: Family Medicine

## 2017-10-27 DIAGNOSIS — R937 Abnormal findings on diagnostic imaging of other parts of musculoskeletal system: Secondary | ICD-10-CM

## 2017-10-27 DIAGNOSIS — M4185 Other forms of scoliosis, thoracolumbar region: Secondary | ICD-10-CM | POA: Diagnosis not present

## 2017-10-29 DIAGNOSIS — M5417 Radiculopathy, lumbosacral region: Secondary | ICD-10-CM | POA: Diagnosis not present

## 2017-10-29 DIAGNOSIS — M5136 Other intervertebral disc degeneration, lumbar region: Secondary | ICD-10-CM | POA: Diagnosis not present

## 2017-10-29 DIAGNOSIS — M9903 Segmental and somatic dysfunction of lumbar region: Secondary | ICD-10-CM | POA: Diagnosis not present

## 2017-11-03 DIAGNOSIS — M9903 Segmental and somatic dysfunction of lumbar region: Secondary | ICD-10-CM | POA: Diagnosis not present

## 2017-11-03 DIAGNOSIS — M5417 Radiculopathy, lumbosacral region: Secondary | ICD-10-CM | POA: Diagnosis not present

## 2017-11-03 DIAGNOSIS — M5136 Other intervertebral disc degeneration, lumbar region: Secondary | ICD-10-CM | POA: Diagnosis not present

## 2017-11-05 DIAGNOSIS — M5417 Radiculopathy, lumbosacral region: Secondary | ICD-10-CM | POA: Diagnosis not present

## 2017-11-05 DIAGNOSIS — M5136 Other intervertebral disc degeneration, lumbar region: Secondary | ICD-10-CM | POA: Diagnosis not present

## 2017-11-05 DIAGNOSIS — M9903 Segmental and somatic dysfunction of lumbar region: Secondary | ICD-10-CM | POA: Diagnosis not present

## 2017-11-10 DIAGNOSIS — M5136 Other intervertebral disc degeneration, lumbar region: Secondary | ICD-10-CM | POA: Diagnosis not present

## 2017-11-10 DIAGNOSIS — M9903 Segmental and somatic dysfunction of lumbar region: Secondary | ICD-10-CM | POA: Diagnosis not present

## 2017-11-10 DIAGNOSIS — M5417 Radiculopathy, lumbosacral region: Secondary | ICD-10-CM | POA: Diagnosis not present

## 2017-11-12 DIAGNOSIS — M5136 Other intervertebral disc degeneration, lumbar region: Secondary | ICD-10-CM | POA: Diagnosis not present

## 2017-11-12 DIAGNOSIS — M5417 Radiculopathy, lumbosacral region: Secondary | ICD-10-CM | POA: Diagnosis not present

## 2017-11-12 DIAGNOSIS — M9903 Segmental and somatic dysfunction of lumbar region: Secondary | ICD-10-CM | POA: Diagnosis not present

## 2017-11-17 DIAGNOSIS — M5417 Radiculopathy, lumbosacral region: Secondary | ICD-10-CM | POA: Diagnosis not present

## 2017-11-17 DIAGNOSIS — M9903 Segmental and somatic dysfunction of lumbar region: Secondary | ICD-10-CM | POA: Diagnosis not present

## 2017-11-17 DIAGNOSIS — M5136 Other intervertebral disc degeneration, lumbar region: Secondary | ICD-10-CM | POA: Diagnosis not present

## 2017-11-20 DIAGNOSIS — M5417 Radiculopathy, lumbosacral region: Secondary | ICD-10-CM | POA: Diagnosis not present

## 2017-11-20 DIAGNOSIS — M9903 Segmental and somatic dysfunction of lumbar region: Secondary | ICD-10-CM | POA: Diagnosis not present

## 2017-11-20 DIAGNOSIS — M5136 Other intervertebral disc degeneration, lumbar region: Secondary | ICD-10-CM | POA: Diagnosis not present

## 2018-01-04 DIAGNOSIS — Z800 Family history of malignant neoplasm of digestive organs: Secondary | ICD-10-CM | POA: Diagnosis not present

## 2018-01-04 DIAGNOSIS — K640 First degree hemorrhoids: Secondary | ICD-10-CM | POA: Diagnosis not present

## 2018-01-04 DIAGNOSIS — Z1211 Encounter for screening for malignant neoplasm of colon: Secondary | ICD-10-CM | POA: Diagnosis not present

## 2018-03-09 DIAGNOSIS — H903 Sensorineural hearing loss, bilateral: Secondary | ICD-10-CM | POA: Diagnosis not present

## 2018-03-18 DIAGNOSIS — R21 Rash and other nonspecific skin eruption: Secondary | ICD-10-CM | POA: Diagnosis not present

## 2018-03-27 ENCOUNTER — Ambulatory Visit (INDEPENDENT_AMBULATORY_CARE_PROVIDER_SITE_OTHER): Payer: 59

## 2018-03-27 ENCOUNTER — Ambulatory Visit: Payer: 59 | Admitting: Podiatry

## 2018-03-27 VITALS — BP 120/84 | HR 72

## 2018-03-27 DIAGNOSIS — M79671 Pain in right foot: Secondary | ICD-10-CM

## 2018-03-27 DIAGNOSIS — B351 Tinea unguium: Secondary | ICD-10-CM

## 2018-03-27 DIAGNOSIS — M79676 Pain in unspecified toe(s): Secondary | ICD-10-CM

## 2018-03-27 DIAGNOSIS — M7732 Calcaneal spur, left foot: Secondary | ICD-10-CM

## 2018-03-27 DIAGNOSIS — M79609 Pain in unspecified limb: Secondary | ICD-10-CM | POA: Diagnosis not present

## 2018-03-27 DIAGNOSIS — L608 Other nail disorders: Secondary | ICD-10-CM | POA: Diagnosis not present

## 2018-03-27 DIAGNOSIS — M722 Plantar fascial fibromatosis: Secondary | ICD-10-CM

## 2018-03-27 DIAGNOSIS — M79672 Pain in left foot: Secondary | ICD-10-CM

## 2018-03-27 DIAGNOSIS — M7751 Other enthesopathy of right foot: Secondary | ICD-10-CM | POA: Diagnosis not present

## 2018-03-27 DIAGNOSIS — L600 Ingrowing nail: Secondary | ICD-10-CM

## 2018-03-27 DIAGNOSIS — M779 Enthesopathy, unspecified: Secondary | ICD-10-CM

## 2018-03-27 DIAGNOSIS — M778 Other enthesopathies, not elsewhere classified: Secondary | ICD-10-CM

## 2018-03-27 DIAGNOSIS — M7752 Other enthesopathy of left foot: Secondary | ICD-10-CM

## 2018-03-27 DIAGNOSIS — L603 Nail dystrophy: Secondary | ICD-10-CM | POA: Diagnosis not present

## 2018-03-27 DIAGNOSIS — M7731 Calcaneal spur, right foot: Secondary | ICD-10-CM

## 2018-03-27 MED ORDER — MELOXICAM 15 MG PO TABS: 15.0000 mg | ORAL_TABLET | Freq: Every day | ORAL | 0 refills | Status: DC

## 2018-03-27 NOTE — Progress Notes (Signed)
Subjective:  Patient ID: Deanna Watkins, female    DOB: 10/07/1960,  MRN: [id number]  Chief Complaint  Patient presents with  . Foot Pain    bilateral heel and arch pain  . Nail Problem    possible toenail fungus    58 y.o. female presents with the above complaint. Reports pain in both feet and heels. Present for a few months. Pain in the ball of the foot as well. Worse with activity.   Also complains of thickening and discolartion to her toenails. Notice them getting darker and thicker and now with white patches. States they hurt at the ends and might be ingrown.  Review of Systems: Negative except as noted in the HPI. Denies N/V/F/Ch.  Past Medical History:  Diagnosis Date  . ANEMIA-NOS 04/22/2007  . GERD 04/22/2007  . Headache(784.0) 07/13/2007  . Menopause   . Sickle cell trait (HCC)   . Sickle-cell trait (HCC) 04/22/2007    Current Outpatient Medications:  .  cetirizine (ZYRTEC) 10 MG tablet, Take 10 mg by mouth daily as needed for allergies., Disp: , Rfl:  .  Cholecalciferol (VITAMIN D3) 2000 units TABS, Take 1 tablet by mouth daily., Disp: , Rfl:  .  cyclobenzaprine (FLEXERIL) 10 MG tablet, Take 1 tablet (10 mg total) by mouth 3 (three) times daily as needed for muscle spasms., Disp: 60 tablet, Rfl: 0 .  Elastic Bandages & Supports (WRIST SPLINT/COCK-UP/LEFT M) MISC, 1 Device by Does not apply route daily. DX: carpal tunnel syndrome, Disp: 1 each, Rfl: 0 .  Elastic Bandages & Supports (WRIST SPLINT/COCK-UP/RIGHT M) MISC, 1 Device by Does not apply route daily. DX: carpal tunnel syndrome, Disp: 1 each, Rfl: 0 .  fluticasone (FLONASE) 50 MCG/ACT nasal spray, Place 2 sprays into both nostrils daily., Disp: 16 g, Rfl: 6 .  Folic Acid-Vit B6-Vit B12 (FOLBEE) 2.5-25-1 MG TABS tablet, Take 1 tablet by mouth daily., Disp: 30 tablet, Rfl: 4 .  ibuprofen (ADVIL,MOTRIN) 200 MG tablet, Take 400-600 mg by mouth every 6 (six) hours as needed for headache or moderate pain., Disp: , Rfl:    .  meclizine (ANTIVERT) 25 MG tablet, Take 1 tablet (25 mg total) by mouth 3 (three) times daily as needed for dizziness. (Patient not taking: Reported on 03/21/2016), Disp: 30 tablet, Rfl: 0 .  Multiple Vitamin (MULTIVITAMIN WITH MINERALS) TABS tablet, Take 1 tablet by mouth daily., Disp: , Rfl:  .  vitamin B-12 (CYANOCOBALAMIN) 500 MCG tablet, Take 500 mcg by mouth daily., Disp: , Rfl:   Social History   Tobacco Use  Smoking Status Never Smoker  Smokeless Tobacco Never Used    No Known Allergies Objective:   Vitals:   03/27/18 0959  BP: 120/84  Pulse: 72   There is no height or weight on file to calculate BMI. Constitutional Well developed. Well nourished.  Vascular Dorsalis pedis pulses palpable bilaterally. Posterior tibial pulses palpable bilaterally. Capillary refill normal to all digits.  No cyanosis or clubbing noted. Pedal hair growth normal.  Neurologic Normal speech. Oriented to person, place, and time. Epicritic sensation to light touch grossly present bilaterally.  Dermatologic Nails slightly ingrowing with white patchy discoloration distally. No open wounds. No skin lesions.  Orthopedic: Normal joint ROM without pain or crepitus bilaterally. No visible deformities. Tender to palpation at the calcaneal tuber bilaterally. No pain with calcaneal squeeze bilaterally. Ankle ROM diminished range of motion bilaterally. Silfverskiold Test: positive bilaterally.   Radiographs: Taken and reviewed. No acute fractures or dislocations. No evidence  of stress fracture.  Plantar heel spur present. Posterior heel spur absent.   Assessment:   1. Pain due to onychomycosis of nail   2. Plantar fasciitis   3. Ingrown nail   4. Pain around toenail   5. Pain of both heels   6. Heel spur, left   7. Heel spur, right    Plan:  Patient was evaluated and treated and all questions answered.  Plantar Fasciitis, bilaterally - XR reviewed as above.  - Educated on icing and  stretching. Instructions given.  - Injection delivered to the plantar fascia as below. - DME: Night splint dispensed. - Pharmacologic management: Meloxicam. Educated on risks/benefits and proper taking of medication.  Procedure: Injection Tendon/Ligament Location: Bilateral plantar fascia at the glabrous junction; medial approach. Skin Prep: alcohol Injectate: 1 cc 0.5% marcaine plain, 1 cc dexamethasone phosphate, 0.5 cc kenalog 10. Disposition: Patient tolerated procedure well. Injection site dressed with a band-aid.  Onychomycosis -Educated on etiology of nail fungus. -Nail sample taken for microbiology and histology. -Will f/u nail culture results for further treatment.     Return in about 3 weeks (around 04/17/2018) for Plantar fasciitis, Bilateral.

## 2018-03-27 NOTE — Addendum Note (Signed)
Addended by: Ventura Sellers on: 03/27/2018 10:43 AM   Modules accepted: Orders

## 2018-04-15 ENCOUNTER — Ambulatory Visit: Payer: 59 | Admitting: Podiatry

## 2018-04-15 DIAGNOSIS — M79671 Pain in right foot: Secondary | ICD-10-CM

## 2018-04-15 DIAGNOSIS — M722 Plantar fascial fibromatosis: Secondary | ICD-10-CM | POA: Diagnosis not present

## 2018-04-15 DIAGNOSIS — M79672 Pain in left foot: Secondary | ICD-10-CM

## 2018-04-15 DIAGNOSIS — B351 Tinea unguium: Secondary | ICD-10-CM | POA: Diagnosis not present

## 2018-04-15 DIAGNOSIS — L603 Nail dystrophy: Secondary | ICD-10-CM

## 2018-04-15 NOTE — Patient Instructions (Signed)

## 2018-04-15 NOTE — Progress Notes (Signed)
Subjective:  Patient ID: Deanna Watkins, female    DOB: July 22, 1960,  MRN: [id number]  Chief Complaint  Patient presents with  . Foot Pain    doing much much better    57 y.o. female presents with the above complaint.  States the left heel is still hurting but is doing much better.  Also here for results of the nail sample taken last visit.  Review of Systems: Negative except as noted in the HPI. Denies N/V/F/Ch.  Past Medical History:  Diagnosis Date  . ANEMIA-NOS 04/22/2007  . GERD 04/22/2007  . Headache(784.0) 07/13/2007  . Menopause   . Sickle cell trait (HCC)   . Sickle-cell trait (HCC) 04/22/2007    Current Outpatient Medications:  .  cetirizine (ZYRTEC) 10 MG tablet, Take 10 mg by mouth daily as needed for allergies., Disp: , Rfl:  .  Cholecalciferol (VITAMIN D3) 2000 units TABS, Take 1 tablet by mouth daily., Disp: , Rfl:  .  cyclobenzaprine (FLEXERIL) 10 MG tablet, Take 1 tablet (10 mg total) by mouth 3 (three) times daily as needed for muscle spasms., Disp: 60 tablet, Rfl: 0 .  Elastic Bandages & Supports (WRIST SPLINT/COCK-UP/LEFT M) MISC, 1 Device by Does not apply route daily. DX: carpal tunnel syndrome, Disp: 1 each, Rfl: 0 .  Elastic Bandages & Supports (WRIST SPLINT/COCK-UP/RIGHT M) MISC, 1 Device by Does not apply route daily. DX: carpal tunnel syndrome, Disp: 1 each, Rfl: 0 .  fluticasone (FLONASE) 50 MCG/ACT nasal spray, Place 2 sprays into both nostrils daily., Disp: 16 g, Rfl: 6 .  Folic Acid-Vit B6-Vit B12 (FOLBEE) 2.5-25-1 MG TABS tablet, Take 1 tablet by mouth daily., Disp: 30 tablet, Rfl: 4 .  ibuprofen (ADVIL,MOTRIN) 200 MG tablet, Take 400-600 mg by mouth every 6 (six) hours as needed for headache or moderate pain., Disp: , Rfl:  .  meclizine (ANTIVERT) 25 MG tablet, Take 1 tablet (25 mg total) by mouth 3 (three) times daily as needed for dizziness. (Patient not taking: Reported on 03/21/2016), Disp: 30 tablet, Rfl: 0 .  meloxicam (MOBIC) 15 MG tablet, Take 1  tablet (15 mg total) by mouth daily., Disp: 30 tablet, Rfl: 0 .  Multiple Vitamin (MULTIVITAMIN WITH MINERALS) TABS tablet, Take 1 tablet by mouth daily., Disp: , Rfl:  .  vitamin B-12 (CYANOCOBALAMIN) 500 MCG tablet, Take 500 mcg by mouth daily., Disp: , Rfl:   Social History   Tobacco Use  Smoking Status Never Smoker  Smokeless Tobacco Never Used    No Known Allergies Objective:   There were no vitals filed for this visit. There is no height or weight on file to calculate BMI. Constitutional Well developed. Well nourished.  Vascular Dorsalis pedis pulses palpable bilaterally. Posterior tibial pulses palpable bilaterally. Capillary refill normal to all digits.  No cyanosis or clubbing noted. Pedal hair growth normal.  Neurologic Normal speech. Oriented to person, place, and time. Epicritic sensation to light touch grossly present bilaterally.  Dermatologic Nails slightly ingrowing with white patchy discoloration distally. No open wounds. No skin lesions.  Orthopedic: Normal joint ROM without pain or crepitus bilaterally. No visible deformities. Tender to palpation at the calcaneal tuber left. No pain with calcaneal squeeze bilaterally. Ankle ROM diminished range of motion bilaterally. Silfverskiold Test: positive bilaterally.   Radiographs:  03/27/18. No acute fractures or dislocations. No evidence of stress fracture.  Plantar heel spur present. Posterior heel spur absent.   Assessment:   1. Plantar fasciitis   2. Pain of left heel  3. Pain of right heel   4. Onychomycosis   5. Nail dystrophy    Plan:  Patient was evaluated and treated and all questions answered.  Plantar Fasciitis, bilaterally -Improved right, still having pain left -Injection delivered to the left heel as below -Discussed continued stretching icing and use   Procedure: Injection Tendon/Ligament Consent: Verbal consent obtained. Location: Left plantar fascia at the glabrous junction; medial  approach. Skin Prep: Alcohol. Injectate: 1 cc 0.5% marcaine plain, 1 cc dexamethasone phosphate, 0.5 cc kenalog 10. Disposition: Patient tolerated procedure well. Injection site dressed with a band-aid.     Nail dystrophy -Culture results reviewed patient evidence of microtrauma rather than fungal etiology  Return if symptoms worsen or fail to improve.

## 2018-04-16 ENCOUNTER — Ambulatory Visit: Payer: 59 | Admitting: Podiatry

## 2018-07-02 DIAGNOSIS — Z6827 Body mass index (BMI) 27.0-27.9, adult: Secondary | ICD-10-CM | POA: Diagnosis not present

## 2018-07-02 DIAGNOSIS — Z01419 Encounter for gynecological examination (general) (routine) without abnormal findings: Secondary | ICD-10-CM | POA: Diagnosis not present

## 2018-09-21 DIAGNOSIS — J0110 Acute frontal sinusitis, unspecified: Secondary | ICD-10-CM | POA: Diagnosis not present

## 2018-11-29 ENCOUNTER — Other Ambulatory Visit: Payer: 59

## 2018-11-29 ENCOUNTER — Other Ambulatory Visit: Payer: Self-pay | Admitting: Family Medicine

## 2018-11-29 DIAGNOSIS — S20211A Contusion of right front wall of thorax, initial encounter: Secondary | ICD-10-CM | POA: Diagnosis not present

## 2019-07-04 ENCOUNTER — Other Ambulatory Visit: Payer: Self-pay

## 2019-07-04 ENCOUNTER — Encounter (HOSPITAL_BASED_OUTPATIENT_CLINIC_OR_DEPARTMENT_OTHER): Payer: Self-pay | Admitting: *Deleted

## 2019-07-04 ENCOUNTER — Emergency Department (HOSPITAL_BASED_OUTPATIENT_CLINIC_OR_DEPARTMENT_OTHER)
Admission: EM | Admit: 2019-07-04 | Discharge: 2019-07-04 | Disposition: A | Discharge status: Home / Self Care | Payer: No Typology Code available for payment source | Attending: Emergency Medicine | Admitting: Emergency Medicine

## 2019-07-04 DIAGNOSIS — R0602 Shortness of breath: Secondary | ICD-10-CM | POA: Insufficient documentation

## 2019-07-04 DIAGNOSIS — Z79899 Other long term (current) drug therapy: Secondary | ICD-10-CM | POA: Diagnosis not present

## 2019-07-04 DIAGNOSIS — W2210XA Striking against or struck by unspecified automobile airbag, initial encounter: Secondary | ICD-10-CM | POA: Diagnosis not present

## 2019-07-04 DIAGNOSIS — Y998 Other external cause status: Secondary | ICD-10-CM | POA: Insufficient documentation

## 2019-07-04 DIAGNOSIS — R42 Dizziness and giddiness: Secondary | ICD-10-CM | POA: Insufficient documentation

## 2019-07-04 DIAGNOSIS — M25552 Pain in left hip: Secondary | ICD-10-CM | POA: Diagnosis present

## 2019-07-04 DIAGNOSIS — Y9389 Activity, other specified: Secondary | ICD-10-CM | POA: Diagnosis not present

## 2019-07-04 DIAGNOSIS — Y92410 Unspecified street and highway as the place of occurrence of the external cause: Secondary | ICD-10-CM | POA: Diagnosis not present

## 2019-07-04 MED ORDER — ACETAMINOPHEN 500 MG PO TABS: 500.0000 mg | ORAL_TABLET | Freq: Four times a day (QID) | ORAL | 0 refills | Status: DC | PRN

## 2019-07-04 MED ORDER — CYCLOBENZAPRINE HCL 10 MG PO TABS: 10.0000 mg | ORAL_TABLET | Freq: Two times a day (BID) | ORAL | 0 refills | Status: DC | PRN

## 2019-07-04 MED ORDER — ACETAMINOPHEN 500 MG PO TABS
1000.0000 mg | ORAL_TABLET | Freq: Once | ORAL | Status: AC
  Administered 2019-07-04: 20:00:00 1000 mg via ORAL
  Filled 2019-07-04: qty 2

## 2019-07-04 MED ORDER — CYCLOBENZAPRINE HCL 10 MG PO TABS
10.0000 mg | ORAL_TABLET | Freq: Once | ORAL | Status: AC
  Administered 2019-07-04: 10 mg via ORAL
  Filled 2019-07-04: qty 1
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
## 2019-07-04 NOTE — Discharge Instructions (Addendum)
Alternate 600 mg of ibuprofen and 500-1000 mg of Tylenol every 3-4 hours as needed for pain.  Take ibuprofen with food to avoid upset stomach issues.  Do not exceed 4000 mg of Tylenol daily.  You may take Flexeril up to twice daily as needed for muscle spasms. This medication may make you drowsy, so I typically only recommended only taking at night. If this medication makes you drowsy throughout the day, no driving, drinking alcohol, or operating heavy machinery. You may also cut these tablets in half if they are too strong.   Apply ice or heat, whichever feels best, to areas of soreness 20 minutes at a time 3-4 times daily.  Do some gentle stretching throughout the day, especially during or after hot showers or baths. Take short frequent walks and avoid prolonged periods of sitting or laying.   Expect to be sore for the next few day and follow up with primary care physician for recheck of ongoing symptoms (especially if symptoms persist longer than 1 week) but return to ER for emergent changing or worsening of symptoms such as severe headache that gets worse, altered mental status/behaving unusually, persistent vomiting, excessive drowsiness, numbness to the arms or legs, unsteady gait, or slurred speech.  

## 2019-07-04 NOTE — ED Notes (Signed)
ED Provider at bedside. 
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
## 2019-08-29 ENCOUNTER — Ambulatory Visit: Payer: 59 | Attending: Internal Medicine

## 2019-08-29 DIAGNOSIS — Z20822 Contact with and (suspected) exposure to covid-19: Secondary | ICD-10-CM

## 2019-08-30 LAB — NOVEL CORONAVIRUS, NAA: SARS-CoV-2, NAA: NOT DETECTED

## 2020-07-14 ENCOUNTER — Other Ambulatory Visit: Payer: Self-pay

## 2020-07-14 ENCOUNTER — Encounter (HOSPITAL_BASED_OUTPATIENT_CLINIC_OR_DEPARTMENT_OTHER): Payer: Self-pay | Admitting: Emergency Medicine

## 2020-07-14 ENCOUNTER — Emergency Department (HOSPITAL_BASED_OUTPATIENT_CLINIC_OR_DEPARTMENT_OTHER)
Admission: EM | Admit: 2020-07-14 | Discharge: 2020-07-15 | Disposition: A | Discharge status: Home / Self Care | Payer: 59 | Attending: Emergency Medicine | Admitting: Emergency Medicine

## 2020-07-14 ENCOUNTER — Emergency Department (HOSPITAL_BASED_OUTPATIENT_CLINIC_OR_DEPARTMENT_OTHER): Payer: 59

## 2020-07-14 DIAGNOSIS — R0789 Other chest pain: Secondary | ICD-10-CM | POA: Insufficient documentation

## 2020-07-14 DIAGNOSIS — M25512 Pain in left shoulder: Secondary | ICD-10-CM | POA: Insufficient documentation

## 2020-07-14 LAB — CBC
HCT: 31.9 % — ABNORMAL LOW (ref 36.0–46.0)
Hemoglobin: 11.4 g/dL — ABNORMAL LOW (ref 12.0–15.0)
MCH: 26.2 pg (ref 26.0–34.0)
MCHC: 35.7 g/dL (ref 30.0–36.0)
MCV: 73.3 fL — ABNORMAL LOW (ref 80.0–100.0)
Platelets: 165 10*3/uL (ref 150–400)
RBC: 4.35 MIL/uL (ref 3.87–5.11)
RDW: 14.4 % (ref 11.5–15.5)
WBC: 9 10*3/uL (ref 4.0–10.5)
nRBC: 0 % (ref 0.0–0.2)

## 2020-07-14 LAB — BASIC METABOLIC PANEL
Anion gap: 9 (ref 5–15)
BUN: 16 mg/dL (ref 6–20)
CO2: 26 mmol/L (ref 22–32)
Calcium: 9.1 mg/dL (ref 8.9–10.3)
Chloride: 104 mmol/L (ref 98–111)
Creatinine, Ser: 0.77 mg/dL (ref 0.44–1.00)
GFR, Estimated: >60 mL/min (ref >60)
Glucose, Bld: 113 mg/dL — ABNORMAL HIGH (ref 70–99)
Potassium: 3.7 mmol/L (ref 3.5–5.1)
Sodium: 139 mmol/L (ref 135–145)

## 2020-07-14 MED ORDER — KETOROLAC TROMETHAMINE 15 MG/ML IJ SOLN
15.0000 mg | Freq: Once | INTRAMUSCULAR | Status: AC
  Administered 2020-07-14: 15 mg via INTRAVENOUS
  Filled 2020-07-14: qty 1

## 2020-07-14 NOTE — ED Triage Notes (Signed)
Reports left sided chest pain for the last three days describes as sharp that radiates into the upper back.  Reports today it seems to be moving across the chest to the right side as well.  denies any other symptoms.

## 2020-07-14 NOTE — ED Provider Notes (Signed)
[facility] EMERGENCY DEPARTMENT Provider Note   CSN: [id number] Arrival date & time: 07/14/20  2230     History Chief Complaint  Patient presents with  . Chest Pain    Deanna Watkins is a 59 y.o. female.  HPI     This a 59 year old female with history of anemia who presents with chest pain.  Patient reports onset of chest pain on Tuesday.  She states that she bent down awkwardly to pick up a pen and had some anterior chest pain.  Pain is in the left side of the chest and sometimes radiates up to the shoulder.  It is worse with movement and with palpation.  She has been "watching" the pain all week.  She has taken Tylenol Motrin with minimal relief.  She rates pain at 4 out of 10.  Today she was at church and somebody helped her with increased pain.  This prompted her to be evaluated.  No shortness of breath or diaphoresis.  No exertional component.  No history of smoking, hypertension, hyperlipidemia.  No history of diabetes.  Denies any upper respiratory symptoms, sick contacts, Covid exposures.  Past Medical History:  Diagnosis Date  . ANEMIA-NOS 04/22/2007  . GERD 04/22/2007  . Headache(784.0) 07/13/2007  . Menopause   . Sickle cell trait (HCC)   . Sickle-cell trait (HCC) 04/22/2007    Patient Active Problem List   Diagnosis Date Noted  . Other acquired hemolytic anemias (HCC) 04/05/2015  . Tinnitus of right ear 10/04/2010  . Allergic rhinitis 07/13/2007  . HEADACHE 07/13/2007  . SICKLE-CELL TRAIT 04/22/2007    Past Surgical History:  Procedure Laterality Date  . ABDOMINAL HYSTERECTOMY  1999  . CHOLECYSTECTOMY    . OOPHORECTOMY  2006     OB History   No obstetric history on file.     Family History  Problem Relation Age of Onset  . Colon cancer Mother   . Breast cancer Sister   . Fibromyalgia Sister     Social History   Tobacco Use  . Smoking status: Never Smoker  . Smokeless tobacco: Never Used  Substance Use Topics  . Alcohol use: No     Alcohol/week: 0.0 standard drinks  . Drug use: No    Home Medications Prior to Admission medications   Medication Sig Start Date End Date Taking? Authorizing Provider  acetaminophen (TYLENOL) 500 MG tablet Take 1 tablet (500 mg total) by mouth every 6 (six) hours as needed. 07/04/19   Fawze, Mina A, PA-C  cetirizine (ZYRTEC) 10 MG tablet Take 10 mg by mouth daily as needed for allergies.    [provider]  Cholecalciferol (VITAMIN D3) 2000 units TABS Take 1 tablet by mouth daily.    [provider]  cyclobenzaprine (FLEXERIL) 10 MG tablet Take 1 tablet (10 mg total) by mouth 2 (two) times daily as needed for muscle spasms. 07/04/19   Jeanie Sewer, PA-C  Elastic Bandages & Supports (WRIST SPLINT/COCK-UP/LEFT M) MISC 1 Device by Does not apply route daily. DX: carpal tunnel syndrome 03/21/16   Tat, Octaviano Batty, DO  Elastic Bandages & Supports (WRIST SPLINT/COCK-UP/RIGHT M) MISC 1 Device by Does not apply route daily. DX: carpal tunnel syndrome 03/21/16   Tat, Octaviano Batty, DO  fluticasone (FLONASE) 50 MCG/ACT nasal spray Place 2 sprays into both nostrils daily. 10/27/14   Terressa Koyanagi, DO  Folic Acid-Vit B6-Vit B12 (FOLBEE) 2.5-25-1 MG TABS tablet Take 1 tablet by mouth daily. 04/04/14   Hyman Hopes,  Marylouise Stacks, FNP  ibuprofen (ADVIL,MOTRIN) 200 MG tablet Take 400-600 mg by mouth every 6 (six) hours as needed for headache or moderate pain.    [provider]  meclizine (ANTIVERT) 25 MG tablet Take 1 tablet (25 mg total) by mouth 3 (three) times daily as needed for dizziness. Patient not taking: Reported on 03/21/2016 02/21/16   Terressa Koyanagi, DO  meloxicam (MOBIC) 15 MG tablet Take 1 tablet (15 mg total) by mouth daily. 03/27/18   Park Liter, DPM  Multiple Vitamin (MULTIVITAMIN WITH MINERALS) TABS tablet Take 1 tablet by mouth daily.    [provider]  vitamin B-12 (CYANOCOBALAMIN) 500 MCG tablet Take 500 mcg by mouth daily.    [provider]    Allergies     Patient has no known allergies.  Review of Systems   Review of Systems  Constitutional: Negative for fever.  Respiratory: Negative for cough and shortness of breath.   Cardiovascular: Positive for chest pain.  Gastrointestinal: Negative for abdominal pain, nausea and vomiting.  Genitourinary: Negative for dysuria.  All other systems reviewed and are negative.   Physical Exam Updated Vital Signs BP 113/77   Pulse 70   Temp 97.6 F (36.4 C) (Oral)   Resp 16   Ht 1.626 m (5\' 4" )   Wt 73.2 kg   SpO2 99%   BMI 27.70 kg/m   Physical Exam Vitals and nursing note reviewed.  Constitutional:      Appearance: She is well-developed and well-nourished. She is not ill-appearing.  HENT:     Head: Normocephalic and atraumatic.  Eyes:     Pupils: Pupils are equal, round, and reactive to light.  Cardiovascular:     Rate and Rhythm: Normal rate and regular rhythm.     Heart sounds: Normal heart sounds.  Pulmonary:     Effort: Pulmonary effort is normal. No respiratory distress.     Breath sounds: No wheezing.  Chest:     Chest wall: Tenderness present. No crepitus.  Abdominal:     General: Bowel sounds are normal.     Palpations: Abdomen is soft.     Tenderness: There is no abdominal tenderness.  Musculoskeletal:     Cervical back: Neck supple.     Right lower leg: No tenderness. No edema.     Left lower leg: No tenderness. No edema.  Skin:    General: Skin is warm and dry.  Neurological:     Mental Status: She is alert and oriented to person, place, and time.  Psychiatric:        Mood and Affect: Mood and affect and mood normal.     ED Results / Procedures / Treatments   Labs (all labs ordered are listed, but only abnormal results are displayed) Labs Reviewed  BASIC METABOLIC PANEL - Abnormal; Notable for the following components:      Result Value   Glucose, Bld 113 (*)    All other components within normal limits  CBC - Abnormal; Notable for the following  components:   Hemoglobin 11.4 (*)    HCT 31.9 (*)    MCV 73.3 (*)    All other components within normal limits  TROPONIN I (HIGH SENSITIVITY)  TROPONIN I (HIGH SENSITIVITY)    EKG EKG Interpretation  Date/Time:  Saturday July 14 2020 23:00:39 EST Ventricular Rate:  76 PR Interval:  128 QRS Duration: 72 QT Interval:  380 QTC Calculation: 427 R Axis:   88 Text Interpretation: Normal  sinus rhythm Low voltage QRS Cannot rule out Anterior infarct , age undetermined Abnormal ECG Confirmed by Ross Marcus ([id number]) on 07/15/2020 12:55:12 AM   Radiology DG Chest 2 View  Result Date: 07/15/2020 CLINICAL DATA:  Acute onset chest pain and right arm pain. EXAM: CHEST - 2 VIEW COMPARISON:  07/15/2017 FINDINGS: The cardiac silhouette is borderline to mildly enlarged. No airspace consolidation, edema, pleural effusion, pneumothorax is identified. Right upper quadrant abdominal surgical clips are noted. No acute osseous abnormality is seen. IMPRESSION: No active cardiopulmonary disease. Electronically Signed   By: Sebastian Ache M.D.   On: 07/15/2020 00:20    Procedures Procedures (including critical care time)  Medications Ordered in ED Medications  ketorolac (TORADOL) 15 MG/ML injection 15 mg (15 mg Intravenous Given 07/14/20 2345)    ED Course  I have reviewed the triage vital signs and the nursing notes.  Pertinent labs & imaging results that were available during my care of the patient were reviewed by me and considered in my medical decision making (see chart for details).    MDM Rules/Calculators/A&P                          Patient presents with ongoing chest pain since Tuesday.  She is overall nontoxic and vital signs are reassuring.  She is low risk for ACS.  EKG is without acute ischemia or arrhythmia.  Chest x-ray shows no evidence of pneumothorax or pneumonia.  Given reproducible nature of pain, consider musculoskeletal etiology.  Patient was given Toradol.  Basic lab  work and troponin negative.  Repeat troponin negative as well.  Doubt PE as patient has no lower extremity swelling, hypoxia, or shortness of breath.  We will not pursue further evaluation for this.  Patient very reassured.  We will have her follow-up with her primary physician.  After history, exam, and medical workup I feel the patient has been appropriately medically screened and is safe for discharge home. Pertinent diagnoses were discussed with the patient. Patient was given return precautions.  Final Clinical Impression(s) / ED Diagnoses Final diagnoses:  Atypical chest pain    Rx / DC Orders ED Discharge Orders    None       Drako Maese, Mayer Masker, MD 07/15/20 [phone]

## 2020-07-15 LAB — TROPONIN I (HIGH SENSITIVITY)
Troponin I (High Sensitivity): 3 ng/L (ref <18)
Troponin I (High Sensitivity): 3 ng/L (ref <18)

## 2020-07-15 NOTE — Discharge Instructions (Addendum)
You are seen today for chest pain.  Your work-up is reassuring including heart testing.  Because of the reproducible nature of your pain, this is likely chest wall pain.  Take ibuprofen as needed.

## 2020-09-02 ENCOUNTER — Encounter: Payer: Self-pay | Admitting: Gastroenterology

## 2020-09-27 ENCOUNTER — Encounter: Payer: Self-pay | Admitting: Gastroenterology

## 2020-12-19 ENCOUNTER — Encounter: Payer: 59 | Admitting: Gastroenterology

## 2021-07-31 DIAGNOSIS — Z23 Encounter for immunization: Secondary | ICD-10-CM | POA: Diagnosis not present

## 2021-07-31 DIAGNOSIS — D509 Iron deficiency anemia, unspecified: Secondary | ICD-10-CM | POA: Diagnosis not present

## 2021-07-31 DIAGNOSIS — Z131 Encounter for screening for diabetes mellitus: Secondary | ICD-10-CM | POA: Diagnosis not present

## 2021-07-31 DIAGNOSIS — Z01812 Encounter for preprocedural laboratory examination: Secondary | ICD-10-CM | POA: Diagnosis not present

## 2021-07-31 DIAGNOSIS — Z0000 Encounter for general adult medical examination without abnormal findings: Secondary | ICD-10-CM | POA: Diagnosis not present

## 2021-07-31 DIAGNOSIS — Z13220 Encounter for screening for lipoid disorders: Secondary | ICD-10-CM | POA: Diagnosis not present

## 2021-07-31 DIAGNOSIS — M8588 Other specified disorders of bone density and structure, other site: Secondary | ICD-10-CM | POA: Diagnosis not present

## 2021-07-31 DIAGNOSIS — H8110 Benign paroxysmal vertigo, unspecified ear: Secondary | ICD-10-CM | POA: Diagnosis not present

## 2021-08-07 DIAGNOSIS — M1612 Unilateral primary osteoarthritis, left hip: Secondary | ICD-10-CM | POA: Diagnosis not present

## 2021-08-12 DIAGNOSIS — M25652 Stiffness of left hip, not elsewhere classified: Secondary | ICD-10-CM | POA: Diagnosis not present

## 2021-08-12 DIAGNOSIS — M1612 Unilateral primary osteoarthritis, left hip: Secondary | ICD-10-CM | POA: Diagnosis not present

## 2021-08-16 DIAGNOSIS — M1612 Unilateral primary osteoarthritis, left hip: Secondary | ICD-10-CM | POA: Diagnosis not present

## 2021-08-19 DIAGNOSIS — M25652 Stiffness of left hip, not elsewhere classified: Secondary | ICD-10-CM | POA: Diagnosis not present

## 2021-08-19 DIAGNOSIS — M6281 Muscle weakness (generalized): Secondary | ICD-10-CM | POA: Diagnosis not present

## 2021-08-19 DIAGNOSIS — Z96642 Presence of left artificial hip joint: Secondary | ICD-10-CM | POA: Diagnosis not present

## 2021-08-27 DIAGNOSIS — M25652 Stiffness of left hip, not elsewhere classified: Secondary | ICD-10-CM | POA: Diagnosis not present

## 2021-08-27 DIAGNOSIS — M6281 Muscle weakness (generalized): Secondary | ICD-10-CM | POA: Diagnosis not present

## 2021-08-27 DIAGNOSIS — Z96642 Presence of left artificial hip joint: Secondary | ICD-10-CM | POA: Diagnosis not present

## 2021-09-10 DIAGNOSIS — M6281 Muscle weakness (generalized): Secondary | ICD-10-CM | POA: Diagnosis not present

## 2021-09-10 DIAGNOSIS — Z96642 Presence of left artificial hip joint: Secondary | ICD-10-CM | POA: Diagnosis not present

## 2021-09-10 DIAGNOSIS — M25652 Stiffness of left hip, not elsewhere classified: Secondary | ICD-10-CM | POA: Diagnosis not present

## 2021-09-11 DIAGNOSIS — H04123 Dry eye syndrome of bilateral lacrimal glands: Secondary | ICD-10-CM | POA: Diagnosis not present

## 2021-09-11 DIAGNOSIS — H2513 Age-related nuclear cataract, bilateral: Secondary | ICD-10-CM | POA: Diagnosis not present

## 2021-09-11 DIAGNOSIS — H40013 Open angle with borderline findings, low risk, bilateral: Secondary | ICD-10-CM | POA: Diagnosis not present

## 2021-09-24 DIAGNOSIS — M6281 Muscle weakness (generalized): Secondary | ICD-10-CM | POA: Diagnosis not present

## 2021-09-24 DIAGNOSIS — M25652 Stiffness of left hip, not elsewhere classified: Secondary | ICD-10-CM | POA: Diagnosis not present

## 2021-09-24 DIAGNOSIS — Z96642 Presence of left artificial hip joint: Secondary | ICD-10-CM | POA: Diagnosis not present

## 2021-10-11 DIAGNOSIS — R0981 Nasal congestion: Secondary | ICD-10-CM | POA: Diagnosis not present

## 2021-10-11 DIAGNOSIS — R52 Pain, unspecified: Secondary | ICD-10-CM | POA: Diagnosis not present

## 2021-10-11 DIAGNOSIS — R5383 Other fatigue: Secondary | ICD-10-CM | POA: Diagnosis not present

## 2021-10-11 DIAGNOSIS — Z03818 Encounter for observation for suspected exposure to other biological agents ruled out: Secondary | ICD-10-CM | POA: Diagnosis not present

## 2021-10-11 DIAGNOSIS — R059 Cough, unspecified: Secondary | ICD-10-CM | POA: Diagnosis not present

## 2021-10-14 DIAGNOSIS — Z01419 Encounter for gynecological examination (general) (routine) without abnormal findings: Secondary | ICD-10-CM | POA: Diagnosis not present

## 2021-10-14 DIAGNOSIS — Z13820 Encounter for screening for osteoporosis: Secondary | ICD-10-CM | POA: Diagnosis not present

## 2021-10-14 DIAGNOSIS — Z1231 Encounter for screening mammogram for malignant neoplasm of breast: Secondary | ICD-10-CM | POA: Diagnosis not present

## 2021-10-29 DIAGNOSIS — Z20828 Contact with and (suspected) exposure to other viral communicable diseases: Secondary | ICD-10-CM | POA: Diagnosis not present

## 2021-10-29 DIAGNOSIS — J029 Acute pharyngitis, unspecified: Secondary | ICD-10-CM | POA: Diagnosis not present

## 2021-12-03 DIAGNOSIS — M25552 Pain in left hip: Secondary | ICD-10-CM | POA: Diagnosis not present

## 2021-12-10 DIAGNOSIS — M25552 Pain in left hip: Secondary | ICD-10-CM | POA: Diagnosis not present

## 2021-12-24 DIAGNOSIS — M25552 Pain in left hip: Secondary | ICD-10-CM | POA: Diagnosis not present

## 2022-01-07 DIAGNOSIS — M25552 Pain in left hip: Secondary | ICD-10-CM | POA: Diagnosis not present

## 2022-01-21 DIAGNOSIS — M25552 Pain in left hip: Secondary | ICD-10-CM | POA: Diagnosis not present

## 2022-02-04 DIAGNOSIS — M25552 Pain in left hip: Secondary | ICD-10-CM | POA: Diagnosis not present

## 2022-02-18 DIAGNOSIS — M25552 Pain in left hip: Secondary | ICD-10-CM | POA: Diagnosis not present

## 2022-02-27 DIAGNOSIS — Z09 Encounter for follow-up examination after completed treatment for conditions other than malignant neoplasm: Secondary | ICD-10-CM | POA: Diagnosis not present

## 2022-02-27 DIAGNOSIS — Z96642 Presence of left artificial hip joint: Secondary | ICD-10-CM | POA: Diagnosis not present

## 2022-03-18 DIAGNOSIS — R5383 Other fatigue: Secondary | ICD-10-CM | POA: Diagnosis not present

## 2022-03-18 DIAGNOSIS — R350 Frequency of micturition: Secondary | ICD-10-CM | POA: Diagnosis not present

## 2022-03-18 DIAGNOSIS — R519 Headache, unspecified: Secondary | ICD-10-CM | POA: Diagnosis not present

## 2022-03-18 DIAGNOSIS — R1031 Right lower quadrant pain: Secondary | ICD-10-CM | POA: Diagnosis not present

## 2022-03-25 DIAGNOSIS — M25552 Pain in left hip: Secondary | ICD-10-CM | POA: Diagnosis not present

## 2022-04-15 DIAGNOSIS — Z23 Encounter for immunization: Secondary | ICD-10-CM | POA: Diagnosis not present

## 2022-05-06 DIAGNOSIS — M25552 Pain in left hip: Secondary | ICD-10-CM | POA: Diagnosis not present

## 2022-08-07 DIAGNOSIS — Z0000 Encounter for general adult medical examination without abnormal findings: Secondary | ICD-10-CM | POA: Diagnosis not present

## 2022-08-07 DIAGNOSIS — D509 Iron deficiency anemia, unspecified: Secondary | ICD-10-CM | POA: Diagnosis not present

## 2022-08-07 DIAGNOSIS — Z13220 Encounter for screening for lipoid disorders: Secondary | ICD-10-CM | POA: Diagnosis not present

## 2022-10-09 DIAGNOSIS — R710 Precipitous drop in hematocrit: Secondary | ICD-10-CM | POA: Diagnosis not present

## 2022-10-09 DIAGNOSIS — R5383 Other fatigue: Secondary | ICD-10-CM | POA: Diagnosis not present

## 2022-10-09 DIAGNOSIS — R0789 Other chest pain: Secondary | ICD-10-CM | POA: Diagnosis not present

## 2022-10-09 DIAGNOSIS — F419 Anxiety disorder, unspecified: Secondary | ICD-10-CM | POA: Diagnosis not present

## 2022-11-04 DIAGNOSIS — Z6826 Body mass index (BMI) 26.0-26.9, adult: Secondary | ICD-10-CM | POA: Diagnosis not present

## 2022-11-04 DIAGNOSIS — Z1272 Encounter for screening for malignant neoplasm of vagina: Secondary | ICD-10-CM | POA: Diagnosis not present

## 2022-11-04 DIAGNOSIS — Z01419 Encounter for gynecological examination (general) (routine) without abnormal findings: Secondary | ICD-10-CM | POA: Diagnosis not present

## 2022-11-04 DIAGNOSIS — Z1231 Encounter for screening mammogram for malignant neoplasm of breast: Secondary | ICD-10-CM | POA: Diagnosis not present

## 2022-11-06 DIAGNOSIS — U071 COVID-19: Secondary | ICD-10-CM | POA: Diagnosis not present

## 2023-02-03 DIAGNOSIS — M25551 Pain in right hip: Secondary | ICD-10-CM | POA: Diagnosis not present

## 2023-02-03 DIAGNOSIS — R1031 Right lower quadrant pain: Secondary | ICD-10-CM | POA: Diagnosis not present

## 2023-02-10 ENCOUNTER — Ambulatory Visit (INDEPENDENT_AMBULATORY_CARE_PROVIDER_SITE_OTHER): Payer: BC Managed Care – PPO

## 2023-02-10 ENCOUNTER — Ambulatory Visit: Payer: BC Managed Care – PPO | Admitting: Family Medicine

## 2023-02-10 VITALS — BP 126/84 | HR 76 | Ht 64.0 in | Wt 157.0 lb

## 2023-02-10 DIAGNOSIS — M545 Low back pain, unspecified: Secondary | ICD-10-CM

## 2023-02-10 DIAGNOSIS — M25551 Pain in right hip: Secondary | ICD-10-CM

## 2023-02-10 DIAGNOSIS — G8929 Other chronic pain: Secondary | ICD-10-CM

## 2023-02-10 DIAGNOSIS — M47816 Spondylosis without myelopathy or radiculopathy, lumbar region: Secondary | ICD-10-CM | POA: Diagnosis not present

## 2023-02-10 DIAGNOSIS — M1611 Unilateral primary osteoarthritis, right hip: Secondary | ICD-10-CM | POA: Diagnosis not present

## 2023-02-10 DIAGNOSIS — Z96642 Presence of left artificial hip joint: Secondary | ICD-10-CM | POA: Diagnosis not present

## 2023-02-10 NOTE — Patient Instructions (Addendum)
Thank you for coming in today.   Please get an Xray today before you leave   I've referred you to Physical Therapy.  Let us know if you don't hear from them in one week.   Check back in 6 weeks 

## 2023-02-10 NOTE — Progress Notes (Signed)
Rubin Payor, PhD, LAT, ATC acting as a scribe for Clementeen Graham, MD.  Deanna Watkins is a 62 y.o. female who presents to [facility] at [facility] today for R hip pain x 1-2wks. Pt locates pain to the anterior aspect and into the groin of R hip.   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   MSK: Right hip: Normal-appearing Normal motion.  No significant pain with flexion and rotation. Intact strength. Unable to reproduce pain with resisted strength testing. Normal gait.  L-spine: Normal appearing. Nontender palpation spinal midline.  Mildly tender palpation right lumbar paraspinal musculature. Decreased lumbar motion.  Some limitation to rotation and flexion. Lower extremity strength is intact.  Lab and Radiology Results  X-ray images lumbar spine and right hip obtained today personally and independently interpreted  Lumbar spine: No acute fractures.  Degenerative changes present.  Right hip: Status post left total hip replacement.  Intact appearing hardware.  Minimal DJD right hip.  No acute fractures.  No AVN head collapse present.  Await formal radiology review   Assessment and Plan: 62 y.o. female with right anterior hip/groin pain.  This has been ongoing for only a few weeks.  She does have a history of DJD/AVN left hip requiring total hip replacement.  Today pain could be due to tendinitis or related muscle irritation.  Could be atypical lumbar radiculopathy at  L2.  Plan for physical therapy trial.  If not improved consider advanced imaging or diagnostic/therapeutic injection.   PDMP not reviewed this encounter. Orders Placed This Encounter  Procedures   DG HIP UNILAT W OR W/O PELVIS 2-3 VIEWS RIGHT    Standing Status:   Future    Number of Occurrences:   1    Standing Expiration Date:   03/13/2023    Order Specific Question:   Reason for Exam (SYMPTOM  OR DIAGNOSIS REQUIRED)    Answer:   right hip pain    Order Specific Question:   Preferred imaging location?    Answer:   [facility]   DG Lumbar Spine 2-3 Views    Standing Status:   Future    Number of Occurrences:   1    Standing Expiration Date:   03/13/2023    Order Specific Question:   Reason for Exam (SYMPTOM  OR DIAGNOSIS REQUIRED)    Answer:   low back pain    Order Specific Question:   Preferred imaging location?    Answer:   [facility]   Ambulatory referral to Physical Therapy    Referral Priority:   Routine    Referral Type:   Physical Medicine    Referral Reason:   Specialty Services Required    Requested Specialty:   Physical Therapy    Number of Visits Requested:   1   No orders of the defined types were placed in this encounter.    Discussed warning signs or symptoms.  Please see discharge instructions. Patient expresses understanding.   The above documentation has been reviewed and is accurate and complete Clementeen Graham, M.D.

## 2023-02-17 NOTE — Progress Notes (Signed)
Lumbar spine x-ray shows mild arthritis.

## 2023-02-17 NOTE — Progress Notes (Signed)
Right hip x-ray shows mild arthritis changes in the hip joint.

## 2023-02-24 DIAGNOSIS — M25559 Pain in unspecified hip: Secondary | ICD-10-CM | POA: Diagnosis not present

## 2023-02-26 DIAGNOSIS — R102 Pelvic and perineal pain: Secondary | ICD-10-CM | POA: Diagnosis not present

## 2023-02-26 DIAGNOSIS — N907 Vulvar cyst: Secondary | ICD-10-CM | POA: Diagnosis not present

## 2023-03-10 DIAGNOSIS — M25559 Pain in unspecified hip: Secondary | ICD-10-CM | POA: Diagnosis not present

## 2023-03-24 ENCOUNTER — Ambulatory Visit: Payer: BC Managed Care – PPO | Admitting: Family Medicine

## 2023-03-24 DIAGNOSIS — M25559 Pain in unspecified hip: Secondary | ICD-10-CM | POA: Diagnosis not present

## 2023-03-28 DIAGNOSIS — M25559 Pain in unspecified hip: Secondary | ICD-10-CM | POA: Diagnosis not present

## 2023-04-06 DIAGNOSIS — K635 Polyp of colon: Secondary | ICD-10-CM | POA: Diagnosis not present

## 2023-04-06 DIAGNOSIS — Z1211 Encounter for screening for malignant neoplasm of colon: Secondary | ICD-10-CM | POA: Diagnosis not present

## 2023-04-06 DIAGNOSIS — K648 Other hemorrhoids: Secondary | ICD-10-CM | POA: Diagnosis not present

## 2023-04-06 DIAGNOSIS — Z800 Family history of malignant neoplasm of digestive organs: Secondary | ICD-10-CM | POA: Diagnosis not present

## 2023-04-14 DIAGNOSIS — M25559 Pain in unspecified hip: Secondary | ICD-10-CM | POA: Diagnosis not present

## 2023-05-04 DIAGNOSIS — M5442 Lumbago with sciatica, left side: Secondary | ICD-10-CM | POA: Diagnosis not present

## 2023-05-07 ENCOUNTER — Emergency Department (HOSPITAL_BASED_OUTPATIENT_CLINIC_OR_DEPARTMENT_OTHER)
Admission: EM | Admit: 2023-05-07 | Discharge: 2023-05-07 | Disposition: A | Discharge status: Home / Self Care | Payer: BC Managed Care – PPO

## 2023-05-07 ENCOUNTER — Encounter (HOSPITAL_BASED_OUTPATIENT_CLINIC_OR_DEPARTMENT_OTHER): Payer: Self-pay | Admitting: Urology

## 2023-05-07 ENCOUNTER — Emergency Department (HOSPITAL_BASED_OUTPATIENT_CLINIC_OR_DEPARTMENT_OTHER): Payer: BC Managed Care – PPO

## 2023-05-07 DIAGNOSIS — R079 Chest pain, unspecified: Secondary | ICD-10-CM | POA: Diagnosis not present

## 2023-05-07 DIAGNOSIS — R0789 Other chest pain: Secondary | ICD-10-CM | POA: Diagnosis not present

## 2023-05-07 LAB — CBC
HCT: 34.5 % — ABNORMAL LOW (ref 36.0–46.0)
Hemoglobin: 12 g/dL (ref 12.0–15.0)
MCH: 25 pg — ABNORMAL LOW (ref 26.0–34.0)
MCHC: 34.8 g/dL (ref 30.0–36.0)
MCV: 71.9 fL — ABNORMAL LOW (ref 80.0–100.0)
Platelets: 175 10*3/uL (ref 150–400)
RBC: 4.8 MIL/uL (ref 3.87–5.11)
RDW: 14.8 % (ref 11.5–15.5)
WBC: 10.1 10*3/uL (ref 4.0–10.5)
nRBC: 0 % (ref 0.0–0.2)

## 2023-05-07 LAB — BASIC METABOLIC PANEL
Anion gap: 11 (ref 5–15)
BUN: 13 mg/dL (ref 8–23)
CO2: 29 mmol/L (ref 22–32)
Calcium: 9.3 mg/dL (ref 8.9–10.3)
Chloride: 102 mmol/L (ref 98–111)
Creatinine, Ser: 0.68 mg/dL (ref 0.44–1.00)
GFR, Estimated: >60 mL/min (ref >60)
Glucose, Bld: 124 mg/dL — ABNORMAL HIGH (ref 70–99)
Potassium: 3.7 mmol/L (ref 3.5–5.1)
Sodium: 142 mmol/L (ref 135–145)

## 2023-05-07 LAB — TROPONIN I (HIGH SENSITIVITY)
Troponin I (High Sensitivity): 3 ng/L (ref <18)
Troponin I (High Sensitivity): 3 ng/L (ref <18)

## 2023-05-07 MED ORDER — ONDANSETRON HCL 4 MG/2ML IJ SOLN
4.0000 mg | Freq: Once | INTRAMUSCULAR | Status: AC
  Administered 2023-05-07: 4 mg via INTRAVENOUS
  Filled 2023-05-07: qty 2

## 2023-05-07 MED ORDER — NAPROXEN 500 MG PO TABS: 500.0000 mg | ORAL_TABLET | Freq: Two times a day (BID) | ORAL | 0 refills | Status: DC

## 2023-05-07 MED ORDER — KETOROLAC TROMETHAMINE 15 MG/ML IJ SOLN
15.0000 mg | Freq: Once | INTRAMUSCULAR | Status: AC
  Administered 2023-05-07: 15 mg via INTRAVENOUS
  Filled 2023-05-07: qty 1

## 2023-05-07 MED ORDER — MORPHINE SULFATE (PF) 2 MG/ML IV SOLN
2.0000 mg | Freq: Once | INTRAVENOUS | Status: AC
  Administered 2023-05-07: 2 mg via INTRAVENOUS
  Filled 2023-05-07: qty 1

## 2023-05-07 NOTE — ED Triage Notes (Signed)
Pt states central chest pain that started this am at 0730 and radiates to her back, Denies SOB, Denies N/V   Taking prednisone for sciatica pain

## 2023-05-07 NOTE — ED Provider Notes (Signed)
[place] EMERGENCY DEPARTMENT AT [facility] Provider Note   CSN: [id number] Arrival date & time: 05/07/23  0930     History  Chief Complaint  Patient presents with   Chest Pain    Deanna Watkins is a 62 y.o. female.  62 year old female with past medical history of sickle cell trait presents emergency department today for this patient.  Patient states she started to experience this this morning around 7 in the morning.  The patient states that she has similar episode a few years ago and was worked up and workup was reassuring.  States that she was recently placed on a Medrol Dosepak for symptomatic type pain.  She states the pain is mostly in the middle of the right and left side and she feels pressure in her left arm.  She denies any recent fevers, chills, or cough.  She denies a history of DVT or pulmonary embolism, recent surgeries, recent travel.  Denies any leg pain or swelling.  Denies any hemoptysis.   Chest Pain      Home Medications Prior to Admission medications   Medication Sig Start Date End Date Taking? Authorizing Provider  naproxen (NAPROSYN) 500 MG tablet Take 1 tablet (500 mg total) by mouth 2 (two) times daily. 05/07/23  Yes Durwin Glaze, MD  acetaminophen (TYLENOL) 500 MG tablet Take 1 tablet (500 mg total) by mouth every 6 (six) hours as needed. 07/04/19   Fawze, Mina A, PA-C  Cholecalciferol (VITAMIN D3) 2000 units TABS Take 1 tablet by mouth daily.    [provider]  Folic Acid-Vit B6-Vit B12 (FOLBEE) 2.5-25-1 MG TABS tablet Take 1 tablet by mouth daily. 04/04/14   Eulis Foster, FNP  Multiple Vitamin (MULTIVITAMIN WITH MINERALS) TABS tablet Take 1 tablet by mouth daily.    [provider]  vitamin B-12 (CYANOCOBALAMIN) 500 MCG tablet Take 500 mcg by mouth daily.    [provider]      Allergies    Patient has no known allergies.    Review of Systems   Review of Systems  Respiratory:  Positive for chest  tightness.   Cardiovascular:  Positive for chest pain.  All other systems reviewed and are negative.   Physical Exam Updated Vital Signs BP 136/80   Pulse 73   Temp 98 F (36.7 C)   Resp 14   Ht 5\' 4"  (1.626 m)   Wt 71.2 kg   SpO2 99%   BMI 26.94 kg/m  Physical Exam Vitals and nursing note reviewed.   Gen: NAD Eyes: PERRL, EOMI HEENT: no oropharyngeal swelling Neck: trachea midline Resp: clear to auscultation bilaterally Card: RRR, no murmurs, rubs, or gallops Abd: nontender, nondistended Extremities: no calf tenderness, no edema Vascular: 2+ radial pulses bilaterally, 2+ DP pulses bilaterally Neuro: No focal deficits Skin: no rashes Psyc: acting appropriately   ED Results / Procedures / Treatments   Labs (all labs ordered are listed, but only abnormal results are displayed) Labs Reviewed  BASIC METABOLIC PANEL - Abnormal; Notable for the following components:      Result Value   Glucose, Bld 124 (*)    All other components within normal limits  CBC - Abnormal; Notable for the following components:   HCT 34.5 (*)    MCV 71.9 (*)    MCH 25.0 (*)    All other components within normal limits  TROPONIN I (HIGH SENSITIVITY)  TROPONIN I (HIGH SENSITIVITY)    EKG None  Radiology DG  Chest Port 1 View  Result Date: 05/07/2023 CLINICAL DATA:  Chest pain. EXAM: PORTABLE CHEST 1 VIEW COMPARISON:  Chest radiograph dated July 14, 2020. FINDINGS: The heart size and mediastinal contours are within normal limits. Both lungs are clear. No pneumothorax or sizable pleural effusion. No acute osseous abnormality. IMPRESSION: No acute cardiopulmonary findings. Electronically Signed   By: Hart Robinsons M.D.   On: 05/07/2023 12:21    Procedures Procedures    Medications Ordered in ED Medications  morphine (PF) 2 MG/ML injection 2 mg (2 mg Intravenous Given 05/07/23 1017)  ondansetron (ZOFRAN) injection 4 mg (4 mg Intravenous Given 05/07/23 1016)  ketorolac (TORADOL)  15 MG/ML injection 15 mg (15 mg Intravenous Given 05/07/23 1106)    ED Course/ Medical Decision Making/ A&P                                 Medical Decision Making 62 year old female with past medical history of sickle cell trait presenting to the emergency department today with chest pain/pressure.  I will further evaluate patient here with basic labs as well as an EKG, chest x-ray, and troponin for further evaluation for ACS, pulmonary edema, pulmonary infiltrates, or pneumothorax.  Based on description of her symptoms and reassuring vital signs suspicion for pulmonary embolism is low at this time.  She is also neurovascularly intact and based on description suspicion for aortic dissection is also low.  The patient's EKG interpreted by me shows a sinus rhythm with a rate of 72 with normal axis, normal intervals, no significant ST-T changes.  The patient's troponins here were negative.  Chest x-ray is unremarkable.  She is feeling better on reassessment.  Her symptoms actually improved more with Toradol.  I will provide her a prescription for NSAIDs.  Given her age I will refer her to cardiology for further provocative testing but at this time this does not appear to be due to ACS.  Will provide her a short course of NSAIDs and she will be discharged with outpatient follow-up.  Amount and/or Complexity of Data Reviewed Labs: ordered. Radiology: ordered.  Risk Prescription drug management.           Final Clinical Impression(s) / ED Diagnoses Final diagnoses:  Chest pain, unspecified type    Rx / DC Orders ED Discharge Orders          Ordered    naproxen (NAPROSYN) 500 MG tablet  2 times daily        05/07/23 1334    Ambulatory referral to Cardiology       Comments: If you have not heard from the Cardiology office within the next 72 hours please call [phone].   05/07/23 1335              Durwin Glaze, MD 05/07/23 1335

## 2023-05-07 NOTE — Discharge Instructions (Signed)
Please try the naproxen to see if this helps your symptoms.  I have referred you to cardiology.  Please return to the emergency department for worsening symptoms.

## 2023-05-11 NOTE — Plan of Care (Signed)
[facility] Tonsillectomy/Adenoidectomy, Postoperative PEDS care plan entered in error.

## 2023-05-21 DIAGNOSIS — M25559 Pain in unspecified hip: Secondary | ICD-10-CM | POA: Diagnosis not present

## 2023-05-23 DIAGNOSIS — M25559 Pain in unspecified hip: Secondary | ICD-10-CM | POA: Diagnosis not present

## 2023-06-24 ENCOUNTER — Ambulatory Visit: Payer: BC Managed Care – PPO | Attending: Cardiovascular Disease | Admitting: Cardiovascular Disease

## 2023-06-24 ENCOUNTER — Encounter: Payer: Self-pay | Admitting: Cardiovascular Disease

## 2023-06-24 VITALS — BP 128/82 | HR 80 | Ht 64.0 in | Wt 161.4 lb

## 2023-06-24 DIAGNOSIS — D573 Sickle-cell trait: Secondary | ICD-10-CM | POA: Diagnosis not present

## 2023-06-24 DIAGNOSIS — R079 Chest pain, unspecified: Secondary | ICD-10-CM | POA: Diagnosis not present

## 2023-06-24 DIAGNOSIS — R0789 Other chest pain: Secondary | ICD-10-CM

## 2023-06-24 DIAGNOSIS — F419 Anxiety disorder, unspecified: Secondary | ICD-10-CM

## 2023-06-24 NOTE — Progress Notes (Unsigned)
Cardiology Office Note    Date:  06/25/2023   ID:  Deanna Watkins, DOB [birth year], MRN [id number]  PCP:  Carilyn Goodpasture, NP  Cardiologist:  Nicki Guadalajara, MD   New cardiology consultation referred by [facility] ER Dr. Beckey Downing.    History of Present Illness:  Deanna Watkins is a 62 y.o. female who is followed by Carilyn Goodpasture, NP at [facility] for primary care.  She has a history of sickle cell trait.  She admits to being under recent increased stress and had follow-up to pain in her chest radiating to her back and down her right arm which was worse with movement of her upper body.  She ultimately presented to [facility] emergency room on May 07, 2023 experiencing more pain since 7 AM.  Prior to that she had recently been placed on Medrol Dosepak for symptomatic type pain he denied any history of DVT, PE, recent surgeries or recent travel.  In the emergency room blood pressure was 136/80.  Troponins were negative.  Chest x-ray was unremarkable.  Symptoms improved with Toradol and she was given a prescription.  ECG showed sinus rhythm at 72 with normal axis, normal intervals and no ST segment changes.  Laboratory was notable for microcytic indices with an MCV of 71 consistent with her sickle cell trait.  She is now referred for cardiology consultation and evaluation.    Past Medical History:  Diagnosis Date   ANEMIA-NOS 04/22/2007   GERD 04/22/2007   Headache(784.0) 07/13/2007   Menopause    Sickle cell trait (HCC)    Sickle-cell trait (HCC) 04/22/2007    Past Surgical History:  Procedure Laterality Date   ABDOMINAL HYSTERECTOMY  1999   CHOLECYSTECTOMY     OOPHORECTOMY  2006    Current Medications: Outpatient Medications Prior to Visit  Medication Sig Dispense Refill   Cholecalciferol (VITAMIN D3) 2000 units TABS Take 1 tablet by mouth daily.     Folic Acid-Vit B6-Vit B12 (FOLBEE) 2.5-25-1 MG TABS tablet Take 1 tablet by mouth daily. 30 tablet 4   Multiple Vitamin (MULTIVITAMIN WITH  MINERALS) TABS tablet Take 1 tablet by mouth daily.     acetaminophen (TYLENOL) 500 MG tablet Take 1 tablet (500 mg total) by mouth every 6 (six) hours as needed. 30 tablet 0   naproxen (NAPROSYN) 500 MG tablet Take 1 tablet (500 mg total) by mouth 2 (two) times daily. 30 tablet 0   vitamin B-12 (CYANOCOBALAMIN) 500 MCG tablet Take 500 mcg by mouth daily.     No facility-administered medications prior to visit.     Allergies:   Patient has no known allergies.   Social History   Socioeconomic History   Marital status: Divorced    Spouse name: Not on file   Number of children: Not on file   Years of education: Not on file   Highest education level: Not on file  Occupational History   Occupation: Aeronautical engineer  Tobacco Use   Smoking status: Never   Smokeless tobacco: Never  Substance and Sexual Activity   Alcohol use: No    Alcohol/week: 0.0 standard drinks of alcohol   Drug use: No   Sexual activity: Not on file  Other Topics Concern   Not on file  Social History Narrative   Not on file   Social Determinants of Health   Financial Resource Strain: Not on file  Food Insecurity: Not on file  Transportation Needs: Not on file  Physical  Activity: Not on file  Stress: Not on file  Social Connections: Unknown (12/03/2021)   Received from [facility]   Social Network    Social Network: Not on file    Socially she is born in [place].  Divorced for 20 years.  She has 1 child.  She attended [employer] high school and graduated from [employer].  She lives alone.  Previously had worked for [employer].  No tobacco history.  She does not drink alcohol.  She does not exercise because she states she is "lazy."  Family History:  The patient's family history includes Breast cancer in her sister; Colon cancer in her mother; Fibromyalgia in her sister.   ROS General: Negative; No fevers, chills, or night sweats;  HEENT: Negative; No changes in vision or hearing, sinus  congestion, difficulty swallowing Pulmonary: Negative; No cough, wheezing, shortness of breath, hemoptysis Cardiovascular: See HPI, probable musculoskeletal chest pain GI: Negative; No nausea, vomiting, diarrhea, or abdominal pain GU: Negative; No dysuria, hematuria, or difficulty voiding Musculoskeletal: Negative; no myalgias, joint pain, or weakness Hematologic/Oncology: Negative; no easy bruising, bleeding Endocrine: Negative; no heat/cold intolerance; no diabetes Neuro: Negative; no changes in balance, headaches Skin: Negative; No rashes or skin lesions Psychiatric: Negative; No behavioral problems, depression Sleep: Negative; No snoring, daytime sleepiness, hypersomnolence, bruxism, restless legs, hypnogognic hallucinations, no cataplexy Other comprehensive 14 point system review is negative.   PHYSICAL EXAM:   VS:  BP 128/82   Pulse 80   Ht 5\' 4"  (1.626 m)   Wt 161 lb 6.4 oz (73.2 kg)   SpO2 94%   BMI 27.70 kg/m    Repeat blood pressure by me was 130/82   Wt Readings from Last 3 Encounters:  06/24/23 161 lb 6.4 oz (73.2 kg)  05/07/23 156 lb 15.5 oz (71.2 kg)  02/10/23 157 lb (71.2 kg)    General: Alert, oriented, no distress.  Skin: normal turgor, no rashes, warm and dry HEENT: Normocephalic, atraumatic. Pupils equal round and reactive to light; sclera anicteric; extraocular muscles intact;  Nose without nasal septal hypertrophy Mouth/Parynx benign; Mallinpatti scale 3 Neck: No JVD, no carotid bruits; normal carotid upstroke Lungs: clear to ausculatation and percussion; no wheezing or rales Chest wall: without tenderness to palpitation Heart: PMI not displaced, RRR, s1 s2 normal, 1/6 systolic murmur, no diastolic murmur, no rubs, gallops, thrills, or heaves Abdomen: soft, nontender; no hepatosplenomehaly, BS+; abdominal aorta nontender and not dilated by palpation. Back: no CVA tenderness Pulses 2+ Musculoskeletal: full range of motion, normal strength, no joint  deformities Extremities: no clubbing cyanosis or edema, Homan's sign negative  Neurologic: grossly nonfocal; Cranial nerves grossly wnl Psychologic: Normal mood and affect   Studies/Labs Reviewed:   EKG Interpretation Date/Time:  Wednesday June 24 2023 09:16:56 EST Ventricular Rate:  80 PR Interval:  128 QRS Duration:  70 QT Interval:  366 QTC Calculation: 422 R Axis:   4  Text Interpretation: Normal sinus rhythm Normal ECG When compared with ECG of 07-May-2023 09:43, PREVIOUS ECG IS PRESENT Confirmed by Nicki Guadalajara ([id number]) on 06/25/2023 5:24:35 PM    Recent Labs:    Latest Ref Rng & Units 06/24/2023   10:45 AM 05/07/2023   10:06 AM 07/14/2020   11:16 PM  BMP  Glucose 70 - 99 mg/dL 75  846  962   BUN 8 - 27 mg/dL 11  13  16    Creatinine 0.57 - 1.00 mg/dL 9.52  8.41  3.24   BUN/Creat Ratio 12 - 28 15  Sodium 134 - 144 mmol/L 144  142  139   Potassium 3.5 - 5.2 mmol/L 4.0  3.7  3.7   Chloride 96 - 106 mmol/L 106  102  104   CO2 20 - 29 mmol/L 23  29  26    Calcium 8.7 - 10.3 mg/dL 9.3  9.3  9.1         Latest Ref Rng & Units 06/24/2023   10:45 AM 02/21/2016    8:34 AM 03/17/2014    8:20 AM  Hepatic Function  Total Protein 6.0 - 8.5 g/dL 7.4  7.3  7.4   Albumin 3.9 - 4.9 g/dL 4.5  4.2  4.0   AST 0 - 40 IU/L 23  27  21    ALT 0 - 32 IU/L 14  19  15    Alk Phosphatase 44 - 121 IU/L 92  75  65   Total Bilirubin 0.0 - 1.2 mg/dL 0.9  1.0  1.3   Bilirubin, Direct 0.0 - 0.3 mg/dL   0.2        Latest Ref Rng & Units 05/07/2023   10:06 AM 07/14/2020   11:16 PM 07/15/2017    5:23 PM  CBC  WBC 4.0 - 10.5 K/uL 10.1  9.0  7.1   Hemoglobin 12.0 - 15.0 g/dL 78.2  95.6  21.3   Hematocrit 36.0 - 46.0 % 34.5  31.9  30.4   Platelets 150 - 400 K/uL 175  165  153    Lab Results  Component Value Date   MCV 71.9 (L) 05/07/2023   MCV 73.3 (L) 07/14/2020   MCV 71.9 (L) 07/15/2017   Lab Results  Component Value Date   TSH 2.75 02/21/2016   Lab Results  Component Value  Date   HGBA1C 4.6 02/21/2016     BNP No results found for: "BNP"  ProBNP No results found for: "PROBNP"   Lipid Panel     Component Value Date/Time   CHOL 168 06/24/2023 1045   TRIG 110 06/24/2023 1045   HDL 55 06/24/2023 1045   CHOLHDL 3.1 06/24/2023 1045   CHOLHDL 3 03/17/2014 0820   VLDL 16.0 03/17/2014 0820   LDLCALC 93 06/24/2023 1045   LABVLDL 20 06/24/2023 1045     RADIOLOGY: No results found.   Additional studies/ records that were reviewed today include:  I have reviewed the records from [facility] health emergency department at [facility] from May 07, 2023    ASSESSMENT:    1. Musculoskeletal chest pain   2. SICKLE-CELL TRAIT   3. Anxiety     PLAN:  Deanna Watkins is a 62 year old female who has a history of sickle cell trait and recently presented to the emergency room with chest pain rating to her back and down her arm which was worse with movement.  In the emergency room, workup was negative.  Her pain ultimately resolved with Toradol anti-inflammatory therapy and prior to that she had used Medrol Dosepak.  Troponins were negative.  EKG was normal without ST segment changes.  Chest x-ray was unremarkable..  Subsequently, her chest pain has stabilized.  He denies any exertional precipitation.  She admits to being under increased stress.  She has experienced some occasional shortness of breath she does have tenderness over the costochondral region distant with musculoskeletal etiology.  Laboratory had shown total cholesterol 151, LDL 84, HDL 50 and triglycerides 92 in January 2024.  Presently, with her occasional shortness of breath, I am recommending she undergo a  2D echo Doppler study for evaluation of systolic and diastolic function and valvular architecture.  For reassurance scheduling her for routine treadmill test to assess exercise capacity and baseline assessment for potential ischemia.  I am scheduling her for a calcium score.  I will recheck a  fasting lipid panel and LP(a) in addition to a comprehensive metabolic panel and depending upon her calcium score and LP(a) patient will be made concerning initiation of lipid-lowering therapy.  Gust the importance of increased exercise and activity.  Oftentimes she used to do chair exercises and has not done any exercise recently.  I will see her back in several months for reevaluation or sooner as needed.   Medication Adjustments/Labs and Tests Ordered: Current medicines are reviewed at length with the patient today.  Concerns regarding medicines are outlined above.  Medication changes, Labs and Tests ordered today are listed in the Patient Instructions below. Patient Instructions  Medication Instructions:  No medication changes *If you need a refill on your cardiac medications before your next appointment, please call your pharmacy*   Lab Work: LIPID, CMET, LPa If you have labs (blood work) drawn today and your tests are completely normal, you will receive your results only by: MyChart Message (if you have MyChart) OR A paper copy in the mail If you have any lab test that is abnormal or we need to change your treatment, we will call you to review the results.   Testing/Procedures: Your physician has requested that you have an echocardiogram. Echocardiography is a painless test that uses sound waves to create images of your heart. It provides your doctor with information about the size and shape of your heart and how well your heart's chambers and valves are working. This procedure takes approximately one hour. There are no restrictions for this procedure. Please do NOT wear cologne, perfume, aftershave, or lotions (deodorant is allowed). Please arrive 15 minutes prior to your appointment time.  Please note: We ask at that you not bring children with you during ultrasound (echo/ vascular) testing. Due to room size and safety concerns, children are not allowed in the ultrasound rooms during  exams. Our front office staff cannot provide observation of children in our lobby area while testing is being conducted. An adult accompanying a patient to their appointment will only be allowed in the ultrasound room at the discretion of the ultrasound technician under special circumstances. We apologize for any inconvenience.   CT coronary calcium score.   Test locations:  [facility]  [facility]  This is $99 out of pocket.   Coronary CalciumScan A coronary calcium scan is an imaging test used to look for deposits of calcium and other fatty materials (plaques) in the inner lining of the blood vessels of the heart (coronary arteries). These deposits of calcium and plaques can partly clog and narrow the coronary arteries without producing any symptoms or warning signs. This puts a person at risk for a heart attack. This test can detect these deposits before symptoms develop. Tell a health care provider about: Any allergies you have. All medicines you are taking, including vitamins, herbs, eye drops, creams, and over-the-counter medicines. Any problems you or family members have had with anesthetic medicines. Any blood disorders you have. Any surgeries you have had. Any medical conditions you have. Whether you are pregnant or may be pregnant. What are the risks? Generally, this is a safe procedure. However, problems may occur,  including: Harm to a pregnant woman and her unborn baby. This test involves the use of radiation. Radiation exposure can be dangerous to a pregnant woman and her unborn baby. If you are pregnant, you generally should not have this procedure done. Slight increase in the risk of cancer. This is because of the radiation involved in the test. What happens before the procedure? No preparation is needed for this procedure. What happens during the procedure? You will undress and  remove any jewelry around your neck or chest. You will put on a hospital gown. Sticky electrodes will be placed on your chest. The electrodes will be connected to an electrocardiogram (ECG) machine to record a tracing of the electrical activity of your heart. A CT scanner will take pictures of your heart. During this time, you will be asked to lie still and hold your breath for 2-3 seconds while a picture of your heart is being taken. The procedure may vary among health care providers and hospitals. What happens after the procedure? You can get dressed. You can return to your normal activities. It is up to you to get the results of your test. Ask your health care provider, or the department that is doing the test, when your results will be ready. Summary A coronary calcium scan is an imaging test used to look for deposits of calcium and other fatty materials (plaques) in the inner lining of the blood vessels of the heart (coronary arteries). Generally, this is a safe procedure. Tell your health care provider if you are pregnant or may be pregnant. No preparation is needed for this procedure. A CT scanner will take pictures of your heart. You can return to your normal activities after the scan is done. This information is not intended to replace advice given to you by your health care provider. Make sure you discuss any questions you have with your health care provider. Document Released: 01/03/2008 Document Revised: 05/26/2016 Document Reviewed: 05/26/2016 Elsevier Interactive Patient Education  2017 [employer].    Your physician has requested that you have en exercise stress myoview. For further information please visit [email or web address] Please follow instruction sheet, as given.   .instex   Follow-Up: At [facility], you and your health needs are our priority.  As part of our continuing mission to provide you with exceptional heart care, we have created designated Provider  Care Teams.  These Care Teams include your primary Cardiologist (physician) and Advanced Practice Providers (APPs -  Physician Assistants and Nurse Practitioners) who all work together to provide you with the care you need, when you need it.  We recommend signing up for the patient portal called "MyChart".  Sign up information is provided on this After Visit Summary.  MyChart is used to connect with patients for Virtual Visits (Telemedicine).  Patients are able to view lab/test results, encounter notes, upcoming appointments, etc.  Non-urgent messages can be sent to your provider as well.   To learn more about what you can do with MyChart, go to [email or web address].    Your next appointment:   3 month(s)  Provider:   Dr. Nicki Guadalajara     Signed, Nicki Guadalajara, MD  06/25/2023 5:31 PM    [facility] [address], [address], [place], [place]  [address] Phone: [phone]

## 2023-06-24 NOTE — Patient Instructions (Addendum)
Medication Instructions:  No medication changes *If you need a refill on your cardiac medications before your next appointment, please call your pharmacy*   Lab Work: LIPID, CMET, LPa If you have labs (blood work) drawn today and your tests are completely normal, you will receive your results only by: MyChart Message (if you have MyChart) OR A paper copy in the mail If you have any lab test that is abnormal or we need to change your treatment, we will call you to review the results.   Testing/Procedures: Your physician has requested that you have an echocardiogram. Echocardiography is a painless test that uses sound waves to create images of your heart. It provides your doctor with information about the size and shape of your heart and how well your heart's chambers and valves are working. This procedure takes approximately one hour. There are no restrictions for this procedure. Please do NOT wear cologne, perfume, aftershave, or lotions (deodorant is allowed). Please arrive 15 minutes prior to your appointment time.  Please note: We ask at that you not bring children with you during ultrasound (echo/ vascular) testing. Due to room size and safety concerns, children are not allowed in the ultrasound rooms during exams. Our front office staff cannot provide observation of children in our lobby area while testing is being conducted. An adult accompanying a patient to their appointment will only be allowed in the ultrasound room at the discretion of the ultrasound technician under special circumstances. We apologize for any inconvenience.   CT coronary calcium score.   Test locations:  [facility]  [facility] at [facility]  This is $99 out of pocket.   Coronary CalciumScan A coronary calcium scan is an imaging test used to look for deposits of calcium and other fatty materials (plaques) in the inner lining of  the blood vessels of the heart (coronary arteries). These deposits of calcium and plaques can partly clog and narrow the coronary arteries without producing any symptoms or warning signs. This puts a person at risk for a heart attack. This test can detect these deposits before symptoms develop. Tell a health care provider about: Any allergies you have. All medicines you are taking, including vitamins, herbs, eye drops, creams, and over-the-counter medicines. Any problems you or family members have had with anesthetic medicines. Any blood disorders you have. Any surgeries you have had. Any medical conditions you have. Whether you are pregnant or may be pregnant. What are the risks? Generally, this is a safe procedure. However, problems may occur, including: Harm to a pregnant woman and her unborn baby. This test involves the use of radiation. Radiation exposure can be dangerous to a pregnant woman and her unborn baby. If you are pregnant, you generally should not have this procedure done. Slight increase in the risk of cancer. This is because of the radiation involved in the test. What happens before the procedure? No preparation is needed for this procedure. What happens during the procedure? You will undress and remove any jewelry around your neck or chest. You will put on a hospital gown. Sticky electrodes will be placed on your chest. The electrodes will be connected to an electrocardiogram (ECG) machine to record a tracing of the electrical activity of your heart. A CT scanner will take pictures of your heart. During this time, you will be asked to lie still and hold your breath for 2-3 seconds while a picture of your heart is being  taken. The procedure may vary among health care providers and hospitals. What happens after the procedure? You can get dressed. You can return to your normal activities. It is up to you to get the results of your test. Ask your health care provider, or the  department that is doing the test, when your results will be ready. Summary A coronary calcium scan is an imaging test used to look for deposits of calcium and other fatty materials (plaques) in the inner lining of the blood vessels of the heart (coronary arteries). Generally, this is a safe procedure. Tell your health care provider if you are pregnant or may be pregnant. No preparation is needed for this procedure. A CT scanner will take pictures of your heart. You can return to your normal activities after the scan is done. This information is not intended to replace advice given to you by your health care provider. Make sure you discuss any questions you have with your health care provider. Document Released: 01/03/2008 Document Revised: 05/26/2016 Document Reviewed: 05/26/2016 Elsevier Interactive Patient Education  2017 [employer].    Your physician has requested that you have en exercise stress myoview. For further information please visit [email or web address] Please follow instruction sheet, as given.   .instex   Follow-Up: At [facility], you and your health needs are our priority.  As part of our continuing mission to provide you with exceptional heart care, we have created designated Provider Care Teams.  These Care Teams include your primary Cardiologist (physician) and Advanced Practice Providers (APPs -  Physician Assistants and Nurse Practitioners) who all work together to provide you with the care you need, when you need it.  We recommend signing up for the patient portal called "MyChart".  Sign up information is provided on this After Visit Summary.  MyChart is used to connect with patients for Virtual Visits (Telemedicine).  Patients are able to view lab/test results, encounter notes, upcoming appointments, etc.  Non-urgent messages can be sent to your provider as well.   To learn more about what you can do with MyChart, go to [email or web address].    Your next  appointment:   3 month(s)  Provider:   Dr. Nicki Guadalajara

## 2023-06-25 ENCOUNTER — Encounter: Payer: Self-pay | Admitting: Cardiovascular Disease

## 2023-06-25 LAB — COMPREHENSIVE METABOLIC PANEL
ALT: 14 [IU]/L (ref 0–32)
AST: 23 [IU]/L (ref 0–40)
Albumin: 4.5 g/dL (ref 3.9–4.9)
Alkaline Phosphatase: 92 [IU]/L (ref 44–121)
BUN/Creatinine Ratio: 15 (ref 12–28)
BUN: 11 mg/dL (ref 8–27)
Bilirubin Total: 0.9 mg/dL (ref 0.0–1.2)
CO2: 23 mmol/L (ref 20–29)
Calcium: 9.3 mg/dL (ref 8.7–10.3)
Chloride: 106 mmol/L (ref 96–106)
Creatinine, Ser: 0.74 mg/dL (ref 0.57–1.00)
Globulin, Total: 2.9 g/dL (ref 1.5–4.5)
Glucose: 75 mg/dL (ref 70–99)
Potassium: 4 mmol/L (ref 3.5–5.2)
Sodium: 144 mmol/L (ref 134–144)
Total Protein: 7.4 g/dL (ref 6.0–8.5)
eGFR: 92 mL/min/{1.73_m2} (ref >59)

## 2023-06-25 LAB — LIPOPROTEIN A (LPA): Lipoprotein (a): 76.4 nmol/L — ABNORMAL HIGH (ref <75.0)

## 2023-06-25 LAB — LIPID PANEL
Chol/HDL Ratio: 3.1 {ratio} (ref 0.0–4.4)
Cholesterol, Total: 168 mg/dL (ref 100–199)
HDL: 55 mg/dL (ref >39)
LDL Chol Calc (NIH): 93 mg/dL (ref 0–99)
Triglycerides: 110 mg/dL (ref 0–149)
VLDL Cholesterol Cal: 20 mg/dL (ref 5–40)

## 2023-07-07 ENCOUNTER — Ambulatory Visit: Payer: BC Managed Care – PPO | Attending: Cardiovascular Disease

## 2023-07-07 DIAGNOSIS — R0789 Other chest pain: Secondary | ICD-10-CM | POA: Diagnosis not present

## 2023-07-07 LAB — EXERCISE TOLERANCE TEST
Angina Index: 0
Duke Treadmill Score: 3
Estimated workload: 4.6
Exercise duration (min): 3 min
Exercise duration (sec): 0 s
MPHR: 159 {beats}/min
Peak HR: 157 {beats}/min
Percent HR: 98 %
RPE: 16
Rest HR: 89 {beats}/min
ST Depression (mm): 0 mm

## 2023-07-14 ENCOUNTER — Ambulatory Visit (HOSPITAL_BASED_OUTPATIENT_CLINIC_OR_DEPARTMENT_OTHER): Payer: BC Managed Care – PPO

## 2023-07-14 DIAGNOSIS — R0789 Other chest pain: Secondary | ICD-10-CM

## 2023-07-14 LAB — ECHOCARDIOGRAM COMPLETE
Area-P 1/2: 3.99 cm2
P 1/2 time: 563 ms
S' Lateral: 2.35 cm

## 2023-07-16 ENCOUNTER — Telehealth: Payer: Self-pay | Admitting: Cardiovascular Disease

## 2023-07-16 NOTE — Telephone Encounter (Signed)
Pt would like to know if she needs the appt for the cardiac scoring. Please advise

## 2023-07-16 NOTE — Telephone Encounter (Signed)
Patient would like to know if the Cardiac Score (scheduled for 07/21/2023) is necessary since her Stress Test and Echo were both WNL.  She states that she feels fine and has not had any concerning symptoms.

## 2023-07-20 NOTE — Telephone Encounter (Signed)
Called and spoke to patient. Below message relayed per Dr Tresa Endo. Patient rescheduled CARDIAC SCORING test for 2/5     Although her chest pain is atypical and probable musculoskeletal coronary calcium score was ordered so that we can be aggressive in identifying if she does have evidence for coronary calcification and attempt to reduce potential future progression with lipid management if present

## 2023-07-20 NOTE — Telephone Encounter (Signed)
Patient called again to follow-up on if she still needs to have the Cardiac Scoring test scheduled for tomorrow (12/31).

## 2023-07-20 NOTE — Telephone Encounter (Signed)
Although her chest pain is atypical and probable musculoskeletal coronary calcium score was ordered so that we can be aggressive in identifying if she does have evidence for coronary calcification and attempt to reduce potential future progression with lipid management if present

## 2023-07-20 NOTE — Telephone Encounter (Signed)
Called and spoke to patient. Verified name and DOB. Patient calling back to see if she still need to have Cardiac Scoring test done. Message sent to Dr Tresa Endo who has not replied and is out of the office today. Patient will reschedule test and wait on a response from Dr Tresa Endo. Please advise.

## 2023-07-21 ENCOUNTER — Ambulatory Visit (HOSPITAL_BASED_OUTPATIENT_CLINIC_OR_DEPARTMENT_OTHER): Payer: BC Managed Care – PPO

## 2023-08-26 ENCOUNTER — Other Ambulatory Visit (HOSPITAL_BASED_OUTPATIENT_CLINIC_OR_DEPARTMENT_OTHER): Payer: BC Managed Care – PPO

## 2023-10-08 ENCOUNTER — Ambulatory Visit (HOSPITAL_BASED_OUTPATIENT_CLINIC_OR_DEPARTMENT_OTHER)
Admission: RE | Admit: 2023-10-08 | Discharge: 2023-10-08 | Disposition: A | Discharge status: Home / Self Care | Payer: Self-pay | Source: Ambulatory Visit | Attending: Cardiovascular Disease | Admitting: Cardiovascular Disease

## 2023-10-08 DIAGNOSIS — R0789 Other chest pain: Secondary | ICD-10-CM | POA: Insufficient documentation

## 2024-08-04 ENCOUNTER — Other Ambulatory Visit: Payer: Self-pay | Admitting: Nurse Practitioner

## 2024-08-04 DIAGNOSIS — H547 Unspecified visual loss: Secondary | ICD-10-CM

## 2024-08-16 ENCOUNTER — Ambulatory Visit
Admission: RE | Admit: 2024-08-16 | Discharge: 2024-08-16 | Disposition: A | Source: Ambulatory Visit | Attending: Nurse Practitioner | Admitting: Nurse Practitioner

## 2024-08-16 DIAGNOSIS — H547 Unspecified visual loss: Secondary | ICD-10-CM
